# Patient Record
Sex: Female | Born: 2000 | Race: Black or African American | Hispanic: No | Marital: Single | Smoking: Current every day smoker
Health system: Southern US, Community
[De-identification: ages and names within clinical notes are randomized; demographics above are authoritative.]

## PROBLEM LIST (undated history)

## (undated) DIAGNOSIS — F32A Depression, unspecified: Secondary | ICD-10-CM

## (undated) DIAGNOSIS — F39 Unspecified mood [affective] disorder: Secondary | ICD-10-CM

## (undated) DIAGNOSIS — H539 Unspecified visual disturbance: Secondary | ICD-10-CM

## (undated) DIAGNOSIS — F329 Major depressive disorder, single episode, unspecified: Secondary | ICD-10-CM

## (undated) DIAGNOSIS — F909 Attention-deficit hyperactivity disorder, unspecified type: Secondary | ICD-10-CM

## (undated) DIAGNOSIS — F1910 Other psychoactive substance abuse, uncomplicated: Secondary | ICD-10-CM

## (undated) DIAGNOSIS — F1994 Other psychoactive substance use, unspecified with psychoactive substance-induced mood disorder: Secondary | ICD-10-CM

## (undated) DIAGNOSIS — E669 Obesity, unspecified: Secondary | ICD-10-CM

## (undated) DIAGNOSIS — J45909 Unspecified asthma, uncomplicated: Secondary | ICD-10-CM

## (undated) DIAGNOSIS — T7840XA Allergy, unspecified, initial encounter: Secondary | ICD-10-CM

## (undated) DIAGNOSIS — F913 Oppositional defiant disorder: Secondary | ICD-10-CM

## (undated) HISTORY — PX: FRACTURE SURGERY: SHX138

---
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
## 2019-05-24 ENCOUNTER — Ambulatory Visit (INDEPENDENT_AMBULATORY_CARE_PROVIDER_SITE_OTHER): Payer: Medicaid Other | Admitting: Pediatrics

## 2019-05-24 ENCOUNTER — Other Ambulatory Visit: Payer: Self-pay

## 2019-05-24 DIAGNOSIS — L739 Follicular disorder, unspecified: Secondary | ICD-10-CM

## 2019-05-24 MED ORDER — CEPHALEXIN 500 MG PO CAPS: 500.0000 mg | ORAL_CAPSULE | Freq: Three times a day (TID) | ORAL | 0 refills | Status: AC

## 2019-05-24 MED ORDER — IBUPROFEN 200 MG PO CAPS: 600.0000 mg | ORAL_CAPSULE | Freq: Four times a day (QID) | ORAL | 0 refills | Status: DC | PRN

## 2019-05-24 NOTE — Progress Notes (Signed)
Virtual Visit via Video Note  I connected with Lacey [name] 's patient  on 05/24/19 at  3:50 PM EDT by a video enabled telemedicine application and verified that I am speaking with the correct person using two identifiers.   Location of patient/parent: home [place]    I discussed the limitations of evaluation and management by telemedicine and the availability of in person appointments.  I discussed that the purpose of this telehealth visit is to provide medical care while limiting exposure to the novel coronavirus.  The mother expressed understanding and agreed to proceed.  Reason for visit: boil in groin area   History of Present Illness:  3 bumps in different areas along bikini line  Has been there for 3 days  Is painful and swollen  Is pus filled Has shaved that area approximately one month ago  Has happened in the past and required antibiotics No fevers or recent illness.   Observations/Objective:  Appears well in no acute distress.  Has 2 pustules with surrounding swelling in groin area and visible ingrown hairs.  No drainage noted  Tender to touch   Assessment and Plan:  18 yo F with folliculitis and associated skin soft tissue abscess developing.  Discussed warm compresses TID to aid in drainage and removal of ingrown hair if able.   Will begin with Keflex for strep and MSSA coverage and if not improved will ad MRSA coverage  Follow up precautions reviewed.   No orders of the defined types were placed in this encounter.  Meds ordered this encounter  Medications  . cephALEXin (KEFLEX) 500 MG capsule    Sig: Take 1 capsule (500 mg total) by mouth 3 (three) times daily for 7 days.    Dispense:  21 capsule    Refill:  0  . Ibuprofen 200 MG CAPS    Sig: Take 3 capsules (600 mg total) by mouth every 6 (six) hours as needed.    Dispense:  120 capsule    Refill:  0     Follow Up Instructions: PRN   I discussed the assessment and treatment plan with the patient and/or  parent/guardian. They were provided an opportunity to ask questions and all were answered. They agreed with the plan and demonstrated an understanding of the instructions.   They were advised to call back or seek an in-person evaluation in the emergency room if the symptoms worsen or if the condition fails to improve as anticipated.  I spent 15 minutes on this telehealth visit inclusive of face-to-face video and care coordination time I was located at [facility] during this encounter.  Georga Hacking, MD

## 2019-06-07 ENCOUNTER — Telehealth: Payer: Self-pay

## 2019-06-07 NOTE — Telephone Encounter (Signed)
LVM to prescreen LV

## 2019-06-08 ENCOUNTER — Ambulatory Visit (INDEPENDENT_AMBULATORY_CARE_PROVIDER_SITE_OTHER): Payer: Medicaid Other | Admitting: Pediatrics

## 2019-06-08 ENCOUNTER — Encounter: Payer: Self-pay | Admitting: Family

## 2019-06-08 ENCOUNTER — Other Ambulatory Visit: Payer: Self-pay

## 2019-06-08 VITALS — BP 131/92 | HR 82 | Ht 61.0 in | Wt 208.0 lb

## 2019-06-08 DIAGNOSIS — F1910 Other psychoactive substance abuse, uncomplicated: Secondary | ICD-10-CM | POA: Diagnosis not present

## 2019-06-08 DIAGNOSIS — Z113 Encounter for screening for infections with a predominantly sexual mode of transmission: Secondary | ICD-10-CM

## 2019-06-08 DIAGNOSIS — Z3046 Encounter for surveillance of implantable subdermal contraceptive: Secondary | ICD-10-CM | POA: Diagnosis not present

## 2019-06-08 DIAGNOSIS — N898 Other specified noninflammatory disorders of vagina: Secondary | ICD-10-CM

## 2019-06-08 NOTE — Progress Notes (Signed)

## 2019-06-08 NOTE — Patient Instructions (Signed)
   Your Nexplanon was removed today and is no longer preventing pregnancy.  If you have sex, remember to use condoms to prevent pregnancy and to prevent sexually transmitted infections.  Leave the outside bandage on for 24 hours.  Leave the smaller bandages on for 3-5 days or until they fall off on their own.  Keep the area clean and dry for 3-5 days.  There is usually bruising or swelling at and around the removal site for a few days to a week after the removal.  If you see redness or pus draining from the removal site, call us immediately.  We would like you to return to the clinic for a follow-up visit in 1 month.  You can call [facility] 24 hours a day with any questions or concerns.  There is always a nurse or doctor available to take your call.  Call 9-1-1 if you have a life-threatening emergency.  For anything else, please call us at [phone] before heading to the ER.

## 2019-06-08 NOTE — Progress Notes (Signed)
History was provided by the patient.  Lacey [name] is a 18 y.o. female who is here for nexplanon removal.  Ben-Davies, Nils Flack, MD   HPI:  Pt reports that she has had the nexplanon for almost 3 years and would like it removed today. She has had a lot of unpredictable bleeding that makes her not want another one at this time. She has considered depo which she has used in the past and is open to the idea of an IUD as she has some friends who have had good success with them.   She has no desire to be pregnant at this time, but does not wish to have a hormonal method of contraception today. She does intend to use condoms and took some from our supply.   Of note, she has been free from substance use for the last 4 months. I congratulated her on this big accomplishment.   She is living at home with her mother and father and describes her home situation as good.   She would like STI testing today. Has some vaginal discharge. No pain with sex.   No LMP recorded. Patient has had an implant.  Review of Systems  Constitutional: Negative for malaise/fatigue and weight loss.  Eyes: Negative for blurred vision.  Respiratory: Negative for shortness of breath.   Cardiovascular: Negative for chest pain and palpitations.  Gastrointestinal: Negative for abdominal pain, constipation, nausea and vomiting.  Genitourinary: Negative for dysuria.  Musculoskeletal: Negative for myalgias.  Neurological: Negative for dizziness and headaches.  Psychiatric/Behavioral: Negative for depression.    Patient Active Problem List   Diagnosis Date Noted  . Polysubstance dependence ([place])   . Substance induced mood disorder ([place])   . MDD (major depressive disorder), recurrent severe, without psychosis ([place]) 11/09/2018  . Polysubstance abuse ([place])   . Altered mental status   . Acute encephalopathy 11/09/2017  . Mild renal insufficiency 11/09/2017  . Anemia 11/09/2017  . Thrombocytosis ([place]) 11/09/2017  .  Nexplanon in place 09/14/2016  . Herpes 08/13/2016  . Other constipation 08/13/2016  . Bipolar 1 disorder, mixed, moderate ([place]) 05/15/2014  . MDD (major depressive disorder), recurrent episode, severe ([place]) 11/21/2013  . ADHD (attention deficit hyperactivity disorder), combined type 11/21/2013    Current Outpatient Medications on File Prior to Visit  Medication Sig Dispense Refill  . acetaminophen (TYLENOL) 325 MG tablet Take 2 tablets (650 mg total) by mouth every 6 (six) hours as needed for mild pain. (Patient not taking: Reported on 05/24/2019)    . bacitracin ointment Apply 1 application topically 2 (two) times daily. (Patient not taking: Reported on 05/24/2019) 1 g 0  . etonogestrel (NEXPLANON) 68 MG IMPL implant 1 each by Subdermal route once. Implanted approx 2016    . Ibuprofen 200 MG CAPS Take 3 capsules (600 mg total) by mouth every 6 (six) hours as needed. (Patient not taking: Reported on 06/08/2019) 120 capsule 0  . mirtazapine (REMERON) 30 MG tablet Take 1 tablet (30 mg total) by mouth at bedtime. For mood (Patient not taking: Reported on 05/24/2019) 30 tablet 0  . nicotine (NICODERM CQ - DOSED IN MG/24 HOURS) 21 mg/24hr patch Place 1 patch (21 mg total) onto the skin daily. For smoking cessation (Patient not taking: Reported on 05/24/2019) 28 patch 0  . valACYclovir (VALTREX) 500 MG tablet Take 2 tablets (1,000 mg total) by mouth See admin instructions. At onset of symptoms take 2 tablets (1000 mg) by mouth twice daily for 10 days (Patient not  taking: Reported on 05/24/2019) 30 tablet 0   No current facility-administered medications on file prior to visit.     Allergies  Allergen Reactions  . Fish Allergy Itching    Throat itching  . Shellfish Allergy Itching    Throat itching     Physical Exam:    Vitals:   06/08/19 0931  BP: (!) 131/92  Pulse: 82  Weight: 208 lb (94.3 kg)  Height: 5\' 1"  (1.549 m)    Blood pressure percentiles are not available for patients who are  18 years or older.  Physical Exam Constitutional:      Appearance: She is well-developed.  HENT:     Head: Normocephalic.  Neck:     Thyroid: No thyromegaly.  Cardiovascular:     Rate and Rhythm: Normal rate and regular rhythm.     Heart sounds: Normal heart sounds.  Pulmonary:     Effort: Pulmonary effort is normal.     Breath sounds: Normal breath sounds.  Abdominal:     General: Bowel sounds are normal.     Palpations: Abdomen is soft.     Tenderness: There is no abdominal tenderness.  Musculoskeletal: Normal range of motion.  Skin:    General: Skin is warm and dry.  Neurological:     Mental Status: She is alert and oriented to person, place, and time.     Assessment/Plan: 1. Encounter for Nexplanon removal See procedure note. I encouraged her to think strongly about another method of hormonal contraception for the future. We discussed condom use and pregnancy risk immediately after removal.   2. Polysubstance abuse ([place]) Currently stable and substance free. She notes it has been difficult, but agrees she is proud of herself.   3. Vaginal discharge Screen for STI today.  - WET PREP BY MOLECULAR PROBE  4. Routine screening for STI (sexually transmitted infection) Per pt request.  - C. trachomatis/N. gonorrhoeae RNA - HIV antibody (with reflex) - RPR

## 2019-06-09 LAB — WET PREP BY MOLECULAR PROBE
Candida species: NOT DETECTED
MICRO NUMBER:: 969754
SPECIMEN QUALITY:: ADEQUATE
Trichomonas vaginosis: NOT DETECTED

## 2019-06-09 LAB — HIV ANTIBODY (ROUTINE TESTING W REFLEX): HIV 1&2 Ab, 4th Generation: NONREACTIVE

## 2019-06-09 LAB — C. TRACHOMATIS/N. GONORRHOEAE RNA
C. trachomatis RNA, TMA: NOT DETECTED
N. gonorrhoeae RNA, TMA: NOT DETECTED

## 2019-06-09 LAB — RPR: RPR Ser Ql: NONREACTIVE

## 2019-06-12 ENCOUNTER — Other Ambulatory Visit: Payer: Self-pay | Admitting: Pediatrics

## 2019-06-12 MED ORDER — METRONIDAZOLE 500 MG PO TABS: 500.0000 mg | ORAL_TABLET | Freq: Two times a day (BID) | ORAL | 0 refills | Status: AC

## 2019-07-19 ENCOUNTER — Other Ambulatory Visit: Payer: Self-pay | Admitting: Pediatrics

## 2019-08-01 DIAGNOSIS — H5213 Myopia, bilateral: Secondary | ICD-10-CM | POA: Diagnosis not present

## 2019-08-14 ENCOUNTER — Other Ambulatory Visit: Payer: Self-pay

## 2019-08-14 ENCOUNTER — Ambulatory Visit (INDEPENDENT_AMBULATORY_CARE_PROVIDER_SITE_OTHER): Payer: Medicaid Other | Admitting: Pediatrics

## 2019-08-14 DIAGNOSIS — Z20828 Contact with and (suspected) exposure to other viral communicable diseases: Secondary | ICD-10-CM

## 2019-08-14 DIAGNOSIS — Z20822 Contact with and (suspected) exposure to covid-19: Secondary | ICD-10-CM

## 2019-08-14 NOTE — Progress Notes (Signed)
Virtual Visit via Video Note  I connected with Lacey [name] 's patient  on 08/14/19 at  3:30 PM EST by a video enabled telemedicine application and verified that I am speaking with the correct person using two identifiers.   Location of patient/parent: [place], [place]   I discussed the limitations of evaluation and management by telemedicine and the availability of in person appointments.  I discussed that the purpose of this telehealth visit is to provide medical care while limiting exposure to the novel coronavirus.  The patient expressed understanding and agreed to proceed.  Reason for visit: concerns for COVID  History of Present Illness:   Came in contact with someone with COVID eposure (boyfriend with COVID +).  coughig more than usual.  However the cough just started this mornng.  No fever. The contact was 1-2 days ago.  She was handing off a purse to the friend who had a boyfriend who has [place].   In addition, yesterday she was around her one hour.  And today for one hour she was around her thirty minutes and they rode in the care.    She went to an urgent care to get a test but she did not have a medicaid card.  She has had no loss of taste or smell.  Observations/Objective:   Well appearing adolescent.   Coughing throughout exam.   Assessment and Plan:   Given prolonged close exposure with close contact having close COVID exposure, likely that patient has indeed been exposed to [place] and is at risk.    1. Recommend testing at outpatient testing facility.  Orders entered and patient asked to text as appropriate for arranging appointment.   2. If there is severe shortness of breath, patient needs to seek medical attention at emergency room.    Follow Up Instructions:  Patient instructed to present for emergent care if there is dyspnea or severe cough.     I discussed the assessment and treatment plan with the patient and/or parent/guardian. They were provided an opportunity to  ask questions and all were answered. They agreed with the plan and demonstrated an understanding of the instructions.   They were advised to call back or seek an in-person evaluation in the emergency room if the symptoms worsen or if the condition fails to improve as anticipated.  I spent 15 minutes on this telehealth visit inclusive of face-to-face video and care coordination time I was located at [employer] and [facility]  during this encounter.  Theodis Sato, MD

## 2019-08-16 ENCOUNTER — Other Ambulatory Visit: Payer: Medicaid Other

## 2019-08-17 ENCOUNTER — Other Ambulatory Visit: Payer: Medicaid Other

## 2019-08-17 DIAGNOSIS — H5213 Myopia, bilateral: Secondary | ICD-10-CM | POA: Diagnosis not present

## 2019-11-03 ENCOUNTER — Telehealth: Payer: Medicaid Other

## 2019-11-07 ENCOUNTER — Other Ambulatory Visit: Payer: Self-pay

## 2019-11-07 ENCOUNTER — Other Ambulatory Visit: Payer: Self-pay | Admitting: Pediatrics

## 2019-11-07 NOTE — Telephone Encounter (Signed)
Please schedule patient for visit so we can discuss HSV and if she needs daily suppressive therapy.

## 2019-11-07 NOTE — Telephone Encounter (Signed)
Per Epic, appointment scheduled 11/13/19.

## 2019-11-07 NOTE — Telephone Encounter (Signed)
Lacey [name] requests new RX for valcyclovir be sent to [facility] on [address]; also asks that refills be available since her condition is ongoing.

## 2019-11-13 ENCOUNTER — Other Ambulatory Visit: Payer: Self-pay

## 2019-11-13 ENCOUNTER — Telehealth (INDEPENDENT_AMBULATORY_CARE_PROVIDER_SITE_OTHER): Payer: Medicaid Other | Admitting: Family

## 2019-11-13 ENCOUNTER — Encounter: Payer: Self-pay | Admitting: Family

## 2019-11-13 ENCOUNTER — Telehealth: Payer: Medicaid Other

## 2019-11-13 DIAGNOSIS — Z3009 Encounter for other general counseling and advice on contraception: Secondary | ICD-10-CM | POA: Diagnosis not present

## 2019-11-13 DIAGNOSIS — Z8659 Personal history of other mental and behavioral disorders: Secondary | ICD-10-CM | POA: Diagnosis not present

## 2019-11-13 DIAGNOSIS — B009 Herpesviral infection, unspecified: Secondary | ICD-10-CM | POA: Diagnosis not present

## 2019-11-13 DIAGNOSIS — F3162 Bipolar disorder, current episode mixed, moderate: Secondary | ICD-10-CM

## 2019-11-13 MED ORDER — VALACYCLOVIR HCL 1 G PO TABS: 1000.0000 mg | ORAL_TABLET | Freq: Every day | ORAL | 11 refills | Status: DC

## 2019-11-13 NOTE — Progress Notes (Signed)
This note is not being shared with the patient for the following reason: To respect privacy (The patient or proxy has requested that the information not be shared).  THIS RECORD MAY CONTAIN CONFIDENTIAL INFORMATION THAT SHOULD NOT BE RELEASED WITHOUT REVIEW OF THE SERVICE PROVIDER.  Virtual Follow-Up Visit via Video Note  I connected with Lacey [name] 's patient  on 11/13/19 at  9:30 AM EDT by an audio enabled telemedicine application and verified that I am speaking with the correct person using two identifiers.   Patient/parent location: home in [place]   I discussed the limitations of evaluation and management by telemedicine and the availability of in person appointments.  I discussed that the purpose of this telehealth visit is to provide medical care while limiting exposure to the novel coronavirus.  The patient expressed understanding and agreed to proceed.   Lacey [name] is a 19 y.o. female referred by Lacey [name], * here today for follow-up of bipolar disorder type 1, history of polysubstance abuse, history of STIs.  Previsit planning completed:  yes   History was provided by the patient.  Plan from Last Visit:   -nexplanon removed 06/07/20  Chief Complaint: HSV2  History of Present Illness:  Lacey [name] reports that goal of the visit today is to refill valtrex. Has been trying for the past 2 months to get refills, but has been unable to get them.   She first noted increased HSV 2 outbreaks in January 2021. Thought it was razor burn, but then saw lesions peeling. Will have an individual lesion that lasts about 1wk before resolving, but pain is persistent. Most recent outbreak was after LMP, last week- still some visible hyperpigmented spots. Stopped shaving ~51yr ago because she thought these spots were ingrown hairs. Reports that outbreaks are sometimes lasting up to 2wks and cause pain with ambulation. No oral outbreaks. She is not able to quantify # of outbreaks this year,  but reports that it is many and worsening severity. She thinks that triggers are increased stress and sexual activity.  Reports that she still has some leftover valtrex, but the bottle states discard after 2021, so she stopped taking it. She last took a dose in January or February. Last Rx was sent several years ago.  She is sexually active currently, both oral and vaginal sex. Uses condoms, reports that she does have 1 partner with whom she has unprotected sex. He is aware of her HSV status and is not a new partner. She has had 1 new female partner since last visit in October and they did use a condom. She had her nexplanon removed in 06/2019 and did not have another replaced. Has been thinking about OCPs, but is worried about weight gain. Does not want depo for that reason.  No other rashes. No fevers. No changes in discharge. No dyspareunia. Occassional morning abdominal pain- thought to be positional after sleeping on couch. No n/v/d. No cough, respiratory symptom.   Still using marijuana. No other drug use.   Regarding stress, she reports that she has been under more stress since the start of 2021. Current stressors include trying to go back to school, and history of mental health issues that she is no longer on medication for. Reports that she has been more aggravated, irritable. Has been off of medications for depression for 3-4 years. No current SI/HI.   Current meds:  -none  Has thought about restarting remeron and concerta. Reports that "concerta was the best thing that  happened to her" but that when she was younger she did not see it that way and did not want to take it. She now realizes that she could accomplish more with improved focus and concentration. She is interested in resuming these meds.   Starting [facility] program March 22nd.   Currently lives with mother, father, and 2 younger sisters.   ROS:  Per HPI  Allergies  Allergen Reactions  . Fish Allergy Itching    Throat itching   . Shellfish Allergy Itching    Throat itching   No outpatient medications prior to visit.   No facility-administered medications prior to visit.     Patient Active Problem List   Diagnosis Date Noted  . Polysubstance dependence ([place])   . Substance induced mood disorder ([place])   . MDD (major depressive disorder), recurrent severe, without psychosis ([place]) 11/09/2018  . Polysubstance abuse ([place])   . Altered mental status   . Acute encephalopathy 11/09/2017  . Mild renal insufficiency 11/09/2017  . Anemia 11/09/2017  . Thrombocytosis ([place]) 11/09/2017  . Nexplanon in place 09/14/2016  . Herpes 08/13/2016  . Other constipation 08/13/2016  . Bipolar 1 disorder, mixed, moderate ([place]) 05/15/2014  . MDD (major depressive disorder), recurrent episode, severe ([place]) 11/21/2013  . ADHD (attention deficit hyperactivity disorder), combined type 11/21/2013    Social History: Lives with:  mother, father and 2 sisters and describes home situation as fine School: dropped out in 9th grade; is resuming GED program March 2021  Confidentiality was discussed with the patient and if applicable, with caregiver as well.  Patient's personal or confidential phone number: [phone] Enter confidential phone number in Family Comments section of [employer] Tobacco?  no Drugs/ETOH?  Current marijuana use, prior cocaine and polysubstance use- has been sober x73yr and reports that she never wants to do that again Partner preference?  female Sexually Active?  yes  Pregnancy Prevention:  condoms, reviewed condoms & plan B Trauma currently or in the pastt?  yes Suicidal or Self-Harm thoughts?   yes, in the past, none current  The following portions of the patient's history were reviewed and updated as appropriate: allergies, current medications, past family history, past medical history, past social history, past surgical history and problem list.  Visual Observations/Objective:   Unable to complete physical  exam given telephone encounter   PHQ-SADS Last 3 Score only 11/13/2019  PHQ-15 Score 13  Total GAD-7 Score 16  Score 21  PHQ-9: 23  Assessment/Plan: Lacey [name] is a 19yo AFAB-IAF with history of polysubstance abuse (sober x50yr, not in treatment program), bipolar disorder type 1, ADHD, and HSV2 who presents to clinic for increasing frequency of HSV2 outbreaks. For the past 3 months, she has had increasing outbreaks, which she attributes to stress and sexual activity. Given the frequency of outbreaks, she would now benefit from suppressive therapy with valtrex 1g daily, which was prescribed today. She had recent STI testing (HIV, RPR, GC/Chlamydia) in 06/2019 which was negative. Though she has not had many new partners and does not have any symptoms concerning for new STI or PID, she would benefit from repeat testing and pregnancy test given history of recent unprotected sex not on birth control. Discussed need for birth control today, and she expressed interest in starting something for contraception as she knows that she does not want children at this time. However, she is very concerned about weight gain and thus would like to discuss options that are less likely to cause significant weight gain.  Will plan to bring her back for in person visit to discuss options further and initiate birth control while obtaining labs.   Regarding her mood and psychiatric history, I am concerned that she is feeling more depressed in the past 3 months. PHQ-SADS score was quite elevated with PHQ-9 score of 23. Fortunately, she does not have any current SI/HI. Discussed that she would benefit from establishing care with a therapist and potentially resuming some of her medications. She is in agreement with this plan and would like to meet with behavioral health when in clinic and discuss restarting pysch medications after her in person visit.   Lastly, she is curious about restarting concerta for ADHD. Given history of  ADHD and current desire to resume GED program, this seems reasonable, however most recent clinic BP is elevated and would want to be cautious about stimulant use with history of bipolar disorder not currently on treatment, thus will wait to discuss this further until she is seen in person for exam/vitals and other mental health issues are addressed.   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  Dispense: 30 tablet; Refill: 11 -discussed plan to start suppressive therapy with patient, who expressed understanding -will reevaluate need for suppressive therapy in 1 year -obtain CMP when next in clinic to assess renal and hepatic function  2. Bipolar 1 disorder, mixed, moderate ([facility]) -PHQ-SADS score concerning for current depressive episode -plan to meet with behavioral health at next visit -advised that patient seek out therapist on [email or web address] -consider resuming prior psych medications (has previously been prescribed Geodon, lamictal, wellbutrin, remeron, trazodone; most recently March 2020 received Geodon 10mg  injection and was discharged with 30mg  remeron nightly)   3. History of ADHD -previously on Concerta 54mg  daily -consider restarting at lower dose once on treatment for bipolar disorder and BP checked  4. Encounter for counseling regarding contraception -patient currently using condoms intermittently  -needs UPT at next visit -interested in OCPs, although she may benefit from LARC such as IUD or nexplanon -desires contraception with minimal risk of weight gain  I discussed the assessment and treatment plan with the patient and/or parent/guardian.  They were provided an opportunity to ask questions and all were answered.  They agreed with the plan and demonstrated an understanding of the instructions. They were advised to call back or seek an in-person evaluation in the emergency room if the  symptoms worsen or if the condition fails to improve as anticipated.   Follow-up:   1 week  Medical decision-making:   I spent 33 minutes on this telehealth visit inclusive of face-to-face video and care coordination time I was located off site during this encounter.   Toney Rakes, MD    CC: Parthenia Ames, NP, Lacey [name], *  Supervising Provider Co-Signature  I reviewed with the resident the medical history and the resident's findings.  I discussed with the resident the patient's diagnosis and concur with the treatment plan as documented in the resident's note.  Parthenia Ames, NP

## 2019-11-14 NOTE — Patient Instructions (Signed)
It was a pleasure to talk with you today! We have sent a prescription for 1 year of suppressive therapy with Valtrex. You will take this daily.   We will schedule you for an in person visit as soon as possible to get an exam, check your blood pressure, get labs, and discuss birth control and anxiety medications further.

## 2019-11-27 ENCOUNTER — Ambulatory Visit (INDEPENDENT_AMBULATORY_CARE_PROVIDER_SITE_OTHER): Payer: Medicaid Other | Admitting: Pediatrics

## 2019-11-27 ENCOUNTER — Other Ambulatory Visit: Payer: Self-pay

## 2019-11-27 ENCOUNTER — Encounter: Payer: Self-pay | Admitting: Pediatrics

## 2019-11-27 ENCOUNTER — Other Ambulatory Visit: Payer: Self-pay | Admitting: Emergency Medicine

## 2019-11-27 ENCOUNTER — Other Ambulatory Visit (HOSPITAL_COMMUNITY)
Admission: RE | Admit: 2019-11-27 | Discharge: 2019-11-27 | Disposition: A | Discharge status: Home / Self Care | Payer: Medicaid Other | Source: Ambulatory Visit | Attending: Pediatrics | Admitting: Pediatrics

## 2019-11-27 VITALS — BP 119/71 | HR 89 | Ht 61.42 in | Wt 222.2 lb

## 2019-11-27 DIAGNOSIS — E6609 Other obesity due to excess calories: Secondary | ICD-10-CM | POA: Diagnosis not present

## 2019-11-27 DIAGNOSIS — O99210 Obesity complicating pregnancy, unspecified trimester: Secondary | ICD-10-CM | POA: Insufficient documentation

## 2019-11-27 DIAGNOSIS — Z113 Encounter for screening for infections with a predominantly sexual mode of transmission: Secondary | ICD-10-CM

## 2019-11-27 DIAGNOSIS — F902 Attention-deficit hyperactivity disorder, combined type: Secondary | ICD-10-CM | POA: Diagnosis not present

## 2019-11-27 DIAGNOSIS — Z7251 High risk heterosexual behavior: Secondary | ICD-10-CM

## 2019-11-27 DIAGNOSIS — N921 Excessive and frequent menstruation with irregular cycle: Secondary | ICD-10-CM | POA: Insufficient documentation

## 2019-11-27 DIAGNOSIS — F3162 Bipolar disorder, current episode mixed, moderate: Secondary | ICD-10-CM

## 2019-11-27 DIAGNOSIS — Z3202 Encounter for pregnancy test, result negative: Secondary | ICD-10-CM | POA: Diagnosis not present

## 2019-11-27 DIAGNOSIS — E66812 Obesity, class 2: Secondary | ICD-10-CM

## 2019-11-27 DIAGNOSIS — N920 Excessive and frequent menstruation with regular cycle: Secondary | ICD-10-CM | POA: Diagnosis not present

## 2019-11-27 LAB — POCT URINE PREGNANCY: Preg Test, Ur: NEGATIVE

## 2019-11-27 MED ORDER — NORETHINDRONE ACET-ETHINYL EST 1.5-30 MG-MCG PO TABS: 1.0000 | ORAL_TABLET | Freq: Every day | ORAL | 11 refills | Status: DC

## 2019-11-27 NOTE — Patient Instructions (Signed)
Oral Contraception Use Oral contraceptive pills (OCPs) are medicines that you take to prevent pregnancy. OCPs work by:  Preventing the ovaries from releasing eggs.  Thickening mucus in the lower part of the uterus (cervix), which prevents sperm from entering the uterus.  Thinning the lining of the uterus (endometrium), which prevents a fertilized egg from attaching to the endometrium. OCPs are highly effective when taken exactly as prescribed. However, OCPs do not prevent sexually transmitted infections (STIs). Safe sex practices, such as using condoms while on an OCP, can help prevent STIs. Before taking OCPs, you may have a physical exam, blood test, and Pap test. A Pap test involves taking a sample of cells from your cervix to check for cancer. Discuss with your health care provider the possible side effects of the OCP you may be prescribed. When you start an OCP, be aware that it can take 2-3 months for your body to adjust to changes in hormone levels. How to take oral contraceptive pills Follow instructions from your health care provider about how to start taking your first cycle of OCPs. Your health care provider may recommend that you:  Start the pill on day 1 of your menstrual period. If you start at this time, you will not need any backup form of birth control (contraception), such as condoms.  Start the pill on the first Sunday after your menstrual period or on the day you get your prescription. In these cases, you will need to use backup contraception for the first week.  Start the pill at any time of your cycle. ? If you take the pill within 5 days of the start of your period, you will not need a backup form of contraception. ? If you start at any other time of your menstrual cycle, you will need to use another form of contraception for 7 days. If your OCP is the type called a minipill, it will protect you from pregnancy after taking it for 2 days (48 hours), and you can stop using  backup contraception after that time. After you have started taking OCPs:  If you forget to take 1 pill, take it as soon as you remember. Take the next pill at the regular time.  If you miss 2 or more pills, call your health care provider. Different pills have different instructions for missed doses. Use backup birth control until your next menstrual period starts.  If you use a 28-day pack that contains inactive pills and you miss 1 of the last 7 pills (pills with no hormones), throw away the rest of the non-hormone pills and start a new pill pack. No matter which day you start the OCP, you will always start a new pack on that same day of the week. Have an extra pack of OCPs and a backup contraceptive method available in case you miss some pills or lose your OCP pack. Follow these instructions at home:  Do not use any products that contain nicotine or tobacco, such as cigarettes and e-cigarettes. If you need help quitting, ask your health care provider.  Always use a condom to protect against STIs. OCPs do not protect against STIs.  Use a calendar to mark the days of your menstrual period.  Read the information and directions that came with your OCP. Talk to your health care provider if you have questions. Contact a health care provider if:  You develop nausea and vomiting.  You have abnormal vaginal discharge or bleeding.  You develop a rash.    You miss your menstrual period. Depending on the type of OCP you are taking, this may be a sign of pregnancy. Ask your health care provider for more information.  You are losing your hair.  You need treatment for mood swings or depression.  You get dizzy when taking the OCP.  You develop acne after taking the OCP.  You become pregnant or think you may be pregnant.  You have diarrhea, constipation, and abdominal pain or cramps.  You miss 2 or more pills. Get help right away if:  You develop chest pain.  You develop shortness of  breath.  You have an uncontrolled or severe headache.  You develop numbness or slurred speech.  You develop visual or speech problems.  You develop pain, redness, and swelling in your legs.  You develop weakness or numbness in your arms or legs. Summary  Oral contraceptive pills (OCPs) are medicines that you take to prevent pregnancy.  OCPs do not prevent sexually transmitted infections (STIs). Always use a condom to protect against STIs.  When you start an OCP, be aware that it can take 2-3 months for your body to adjust to changes in hormone levels.  Read all the information and directions that come with your OCP. This information is not intended to replace advice given to you by your health care provider. Make sure you discuss any questions you have with your health care provider. Document Revised: 12/09/2018 Document Reviewed: 09/28/2016 Elsevier Patient Education  2020 [employer]  

## 2019-11-27 NOTE — Progress Notes (Signed)
THIS RECORD MAY CONTAIN CONFIDENTIAL INFORMATION THAT SHOULD NOT BE RELEASED WITHOUT REVIEW OF THE SERVICE PROVIDER.  Adolescent Medicine Consultation Follow-Up Visit Lacey [name]  is a 19 y.o. female referred by Parthenia Ames, NP here today for follow-up regarding HSV lesions, mental health, and BC options.     Plan at last adolescent specialty clinic visit included  - starting on daily Valtrex for HSV suppression. Needs labs today. - RTC for STD testing and to discuss [facility] options. - Concern for worsening depression in setting of Bipolar 1 -- recommended getting plugged into therapy. Also concerned about Hx of ADHD.  Pertinent Labs? No Growth Chart Viewed? yes   History was provided by the patient.  Interpreter? no  Chief complaint: Attention issues  HPI:    My Chart Activated?   no  Patient's personal or confidential phone number: updated in chart  Mental health concerns: Patient still reports attention issues, especially now that she is taking classes. Also reports that she has had more issues with her mood, particularly depression, recently, though attention is more of her concern. She denies SI/HI. She has not seen a psychiatrist before, though has had therapy with Sharlyne Pacas in [place] before, but hasn't gone in a while.   HSV: Patient started valtrex as prescribed and lesions resolved within 2 weeks. Now without any lesions or pain. No vaginal discharge or bleeding. Still occasionally with unprotected sex.  Abdominal pain: Has had lower and R sided abdominal pain in the mornings only for the past couple of weeks. Initially thought it was related to HSV, though now not sure as the pain has persisted beyond the lesions improving. The pain is stabbing in quality. She thinks it may be related to sleeping on the couch. No vomiting, diarrhea, or constipation. No pain with sex. Pain is reproducible every morning and is otherwise not paroxysmal. She notes that she has gained  a lot of weight recently due to "eating a lot". She does endorse frequent MJ use.   BC: Patient is sexually active with female partners and occasionally has unprotected sex. Wants to talk about BC. Has had nexplanon in the past but had breakthrough bleeding that she did not like. Had depo before that and didn't like it because she felt it caused weight gain. Has thought of IUD, though her mother strongly recommended against it as "it will fall out". After much discussion, she expressed preference for pills. No personal history of migraines, though dad has them. No personal history of clots; only one in family who had one was sister, though she had cancer (passed away from heart failure as a complication of chemotherapy).   Menstrual history: LMP 3 weeks ago. Periods come monthly, last 7 days, bleeds through tampons with pad every hour or so the first couple of days. No bad cramping.   Review of Systems negative except where noted above   Allergies  Allergen Reactions  . Fish Allergy Itching    Throat itching  . Shellfish Allergy Itching    Throat itching   Current Outpatient Medications on File Prior to Visit  Medication Sig Dispense Refill  . valACYclovir (VALTREX) 1000 MG tablet Take 1 tablet (1,000 mg total) by mouth daily. 30 tablet 11   No current facility-administered medications on file prior to visit.    Patient Active Problem List   Diagnosis Date Noted  . Polysubstance dependence ([place])   . Substance induced mood disorder ([place])   . MDD (major depressive disorder), recurrent  severe, without psychosis ([place]) 11/09/2018  . Polysubstance abuse ([place])   . Altered mental status   . Acute encephalopathy 11/09/2017  . Mild renal insufficiency 11/09/2017  . Anemia 11/09/2017  . Thrombocytosis ([place]) 11/09/2017  . Nexplanon in place 09/14/2016  . Herpes 08/13/2016  . Other constipation 08/13/2016  . Bipolar 1 disorder, mixed, moderate ([place]) 05/15/2014  . MDD (major depressive  disorder), recurrent episode, severe ([place]) 11/21/2013  . ADHD (attention deficit hyperactivity disorder), combined type 11/21/2013    Confidentiality was discussed with the patient and if applicable, with caregiver as well.  Changes at home or school since last visit:  no  Drugs/ETOH?  yes, MJ Partner preference?  female  Sexually Active?  yes; sometimes uses condoms  Suicidal or homicidal thoughts?   no Self injurious behaviors?  no   The following portions of the patient's history were reviewed and updated as appropriate: allergies, current medications, past family history, past medical history, past social history, past surgical history and problem list.  Physical Exam:  Vitals:   11/27/19 0907  BP: 119/71  Pulse: 89  Weight: 222 lb 3.2 oz (100.8 kg)  Height: 5' 1.42" (1.56 m)   BP 119/71   Pulse 89   Ht 5' 1.42" (1.56 m)   Wt 222 lb 3.2 oz (100.8 kg)   BMI 41.42 kg/m  Body mass index: body mass index is 41.42 kg/m. Blood pressure percentiles are not available for patients who are 18 years or older.   Physical Exam Vitals and nursing note reviewed.  Constitutional:      General: She is not in acute distress.    Appearance: She is well-developed. She is obese.  HENT:     Head: Normocephalic and atraumatic.     Right Ear: External ear normal.     Left Ear: External ear normal.  Eyes:     General:        Right eye: No discharge.        Left eye: No discharge.     Conjunctiva/sclera: Conjunctivae normal.     Pupils: Pupils are equal, round, and reactive to light.  Cardiovascular:     Rate and Rhythm: Normal rate and regular rhythm.     Heart sounds: Normal heart sounds. No murmur.  Pulmonary:     Effort: Pulmonary effort is normal.     Breath sounds: Normal breath sounds. No wheezing or rales.  Abdominal:     General: Bowel sounds are normal. There is no distension.     Palpations: Abdomen is soft. There is no mass.     Tenderness: There is no abdominal  tenderness.     Comments: With tenderness to palpation of the suprapubic region and RLQ that extends just superiorly to the iliac crest and continues to the mid axillary lines. No rebound tenderness.   Genitourinary:    Comments: External exam only. With healing HSV lesions without active discharge on the anterior groin, sparing mons. No bleeding or discharge noted at the introitus.  Musculoskeletal:        General: No deformity. Normal range of motion.     Cervical back: Normal range of motion and neck supple.  Lymphadenopathy:     Cervical: No cervical adenopathy.  Skin:    General: Skin is warm and dry.     Capillary Refill: Capillary refill takes less than 2 seconds.     Coloration: Skin is not pale.     Findings: No rash.  Neurological:  Mental Status: She is alert and oriented to person, place, and time.     Deep Tendon Reflexes: Reflexes are normal and symmetric.  Psychiatric:        Behavior: Behavior normal.     Assessment/Plan:  1. Menorrhagia with regular cycle 2. History of unprotected sex 3. Pregnancy examination or test, negative result - Discussed all BC options, including effectiveness, risks, and benefits of each today. Patient expressed interest in OCPs today. Will start on Junel.  - history of clotting in sister, but this was likely related to her cancer. Regardless, counseled patient on increased risk of clots. She expressed understanding.  - Hopefully this will help with her menorrhagia - discussed importance of condoms in preventing STIs and discussed EC options.   - CBC with Differential/Platelet - Thyroid Panel With TSH - Ferritin - Norethindrone Acetate-Ethinyl Estradiol (JUNEL 1.5/30) 1.5-30 MG-MCG tablet; Take 1 tablet by mouth daily.  Dispense: 1 Package; Refill: 11   4. Routine screening for STI (sexually transmitted infection) - Has history of HSV2 - Hx of unprotected sex, will perform screening.   - POCT urine pregnancy - Urine cytology  ancillary only - HIV Antibody (routine testing w rflx) - RPR - C. trachomatis/N. gonorrhoeae RNA  5. Bipolar 1 disorder, mixed, moderate (HCC) 6. ADHD (attention deficit hyperactivity disorder), combined type - given complicated psychiatric history and multiple meds in the past, would benefit from the care of a dedicated psychiatrist (see prior notes for full med list).  - Will refer to psychiatry and therapy today - no SI/HI  - Ambulatory referral to Psychiatry - Ambulatory referral to [facility]  7. Class 2 obesity due to excess calories in adult, unspecified BMI, unspecified whether serious comorbidity present - patient with significant weight gain recently. - unsure of who her PCP is and when she last got labs. - will get obesity labs today. Fortunately with good BP. Healthy lifestyles counseling re: diet and activity were not in the scope of today's talks.   - Hemoglobin A1c - Comprehensive metabolic panel - Lipid panel - VITAMIN D 25 Hydroxy (Vit-D Deficiency, Fractures) - CBC with Differential/Platelet - Thyroid Panel With TSH   BH screenings: none today  Follow-up:  Return for F/u [facility] and HSV with [facility] on 4/28 at 2:30 virtual.   Medical decision-making:  >30 minutes spent face to face with patient with more than 50% of appointment spent discussing diagnosis, management, follow-up, and reviewing of prior records.  Renee Rival, MD

## 2019-11-28 LAB — RPR: RPR Ser Ql: NONREACTIVE

## 2019-11-28 LAB — COMPREHENSIVE METABOLIC PANEL
AG Ratio: 1.6 (calc) (ref 1.0–2.5)
ALT: 17 U/L (ref 5–32)
AST: 13 U/L (ref 12–32)
Albumin: 4.2 g/dL (ref 3.6–5.1)
Alkaline phosphatase (APISO): 81 U/L (ref 36–128)
BUN: 9 mg/dL (ref 7–20)
CO2: 25 mmol/L (ref 20–32)
Calcium: 9.5 mg/dL (ref 8.9–10.4)
Chloride: 107 mmol/L (ref 98–110)
Creat: 0.67 mg/dL (ref 0.50–1.00)
Globulin: 2.6 g/dL (calc) (ref 2.0–3.8)
Glucose, Bld: 80 mg/dL (ref 65–99)
Potassium: 4.9 mmol/L (ref 3.8–5.1)
Sodium: 139 mmol/L (ref 135–146)
Total Bilirubin: 0.2 mg/dL (ref 0.2–1.1)
Total Protein: 6.8 g/dL (ref 6.3–8.2)

## 2019-11-28 LAB — CBC WITH DIFFERENTIAL/PLATELET
Absolute Monocytes: 542 cells/uL (ref 200–950)
Basophils Absolute: 19 cells/uL (ref 0–200)
Basophils Relative: 0.3 %
Eosinophils Absolute: 410 cells/uL (ref 15–500)
Eosinophils Relative: 6.5 %
HCT: 37.7 % (ref 35.0–45.0)
Hemoglobin: 12.4 g/dL (ref 11.7–15.5)
Lymphs Abs: 2955 cells/uL (ref 850–3900)
MCH: 25.4 pg — ABNORMAL LOW (ref 27.0–33.0)
MCHC: 32.9 g/dL (ref 32.0–36.0)
MCV: 77.3 fL — ABNORMAL LOW (ref 80.0–100.0)
MPV: 10.7 fL (ref 7.5–12.5)
Monocytes Relative: 8.6 %
Neutro Abs: 2375 cells/uL (ref 1500–7800)
Neutrophils Relative %: 37.7 %
Platelets: 344 10*3/uL (ref 140–400)
RBC: 4.88 10*6/uL (ref 3.80–5.10)
RDW: 14.7 % (ref 11.0–15.0)
Total Lymphocyte: 46.9 %
WBC: 6.3 10*3/uL (ref 3.8–10.8)

## 2019-11-28 LAB — FERRITIN: Ferritin: 8 ng/mL — ABNORMAL LOW (ref 16–154)

## 2019-11-28 LAB — LIPID PANEL
Cholesterol: 146 mg/dL (ref <170)
HDL: 44 mg/dL — ABNORMAL LOW (ref >45)
LDL Cholesterol (Calc): 75 mg/dL (calc) (ref <110)
Non-HDL Cholesterol (Calc): 102 mg/dL (calc) (ref <120)
Total CHOL/HDL Ratio: 3.3 (calc) (ref <5.0)
Triglycerides: 174 mg/dL — ABNORMAL HIGH (ref <90)

## 2019-11-28 LAB — THYROID PANEL WITH TSH
Free Thyroxine Index: 2 (ref 1.4–3.8)
T3 Uptake: 28 % (ref 22–35)
T4, Total: 7 ug/dL (ref 5.3–11.7)
TSH: 0.73 mIU/L

## 2019-11-28 LAB — VITAMIN D 25 HYDROXY (VIT D DEFICIENCY, FRACTURES): Vit D, 25-Hydroxy: 8 ng/mL — ABNORMAL LOW (ref 30–100)

## 2019-11-28 LAB — HEMOGLOBIN A1C
Hgb A1c MFr Bld: 5.2 % of total Hgb (ref <5.7)
Mean Plasma Glucose: 103 (calc)
eAG (mmol/L): 5.7 (calc)

## 2019-11-28 LAB — HIV ANTIBODY (ROUTINE TESTING W REFLEX): HIV 1&2 Ab, 4th Generation: NONREACTIVE

## 2019-11-28 NOTE — Progress Notes (Signed)
I have reviewed the resident's note and plan of care and helped develop the plan as necessary. I served as Producer, television/film/video for physical exam.  Jonathon Resides, FNP

## 2019-11-30 ENCOUNTER — Other Ambulatory Visit: Payer: Self-pay | Admitting: Pediatrics

## 2019-11-30 LAB — URINE CYTOLOGY ANCILLARY ONLY
Bacterial Vaginitis-Urine: POSITIVE — AB
Bacterial Vaginitis-Urine: POSITIVE — AB
Bacterial Vaginitis-Urine: POSITIVE — AB
Candida Urine: NEGATIVE
Candida vaginitis: NEGATIVE
Chlamydia: NEGATIVE
Comment: NEGATIVE
Comment: NEGATIVE
Comment: NORMAL
Neisseria Gonorrhea: NEGATIVE
Trichomonas: NEGATIVE

## 2019-11-30 MED ORDER — METRONIDAZOLE 500 MG PO TABS: 500.0000 mg | ORAL_TABLET | Freq: Two times a day (BID) | ORAL | 0 refills | Status: AC

## 2019-11-30 MED ORDER — FERROUS FUMARATE 325 (106 FE) MG PO TABS: 1.0000 | ORAL_TABLET | Freq: Every day | ORAL | 3 refills | Status: DC

## 2019-11-30 MED ORDER — VITAMIN D (ERGOCALCIFEROL) 1.25 MG (50000 UNIT) PO CAPS: 50000.0000 [IU] | ORAL_CAPSULE | ORAL | 0 refills | Status: DC

## 2019-12-27 ENCOUNTER — Telehealth (INDEPENDENT_AMBULATORY_CARE_PROVIDER_SITE_OTHER): Payer: Medicaid Other | Admitting: Pediatrics

## 2019-12-27 DIAGNOSIS — Z3041 Encounter for surveillance of contraceptive pills: Secondary | ICD-10-CM

## 2019-12-27 DIAGNOSIS — R69 Illness, unspecified: Secondary | ICD-10-CM

## 2019-12-27 NOTE — Progress Notes (Signed)
This note is not being shared with the patient for the following reason: To prevent harm (release of this note would result in harm to the life or physical safety of the patient or another).  THIS RECORD MAY CONTAIN CONFIDENTIAL INFORMATION THAT SHOULD NOT BE RELEASED WITHOUT REVIEW OF THE SERVICE PROVIDER.  Virtual Follow-Up Visit via Video Note  I connected with Lacey [name], the patient  on 12/27/19 at  2:30 PM EDT by a video enabled telemedicine application and verified that I am speaking with the correct person using two identifiers.   Patient/parent location: At home in [place], [place]   I discussed the limitations of evaluation and management by telemedicine and the availability of in person appointments.  I discussed that the purpose of this telehealth visit is to provide medical care while limiting exposure to the novel coronavirus.  The patient expressed understanding but requested to reschedule this appt because she has not yet started the birth control prescribed at the last visit. Would like to try before having a visit.   - Discussed trying to follow up in 1-2 weeks before ending the phone call.   Magda Kiel, MD   CC: Parthenia Ames, NP, Parthenia Ames, NP

## 2020-01-10 ENCOUNTER — Telehealth: Payer: Medicaid Other | Admitting: Family

## 2020-01-23 ENCOUNTER — Ambulatory Visit: Payer: Medicaid Other | Admitting: Pediatrics

## 2020-01-24 ENCOUNTER — Encounter: Payer: Self-pay | Admitting: Family

## 2020-01-25 ENCOUNTER — Ambulatory Visit (INDEPENDENT_AMBULATORY_CARE_PROVIDER_SITE_OTHER): Payer: Medicaid Other | Admitting: Pediatrics

## 2020-01-25 ENCOUNTER — Other Ambulatory Visit: Payer: Self-pay | Admitting: Emergency Medicine

## 2020-01-25 ENCOUNTER — Other Ambulatory Visit: Payer: Self-pay

## 2020-01-25 ENCOUNTER — Other Ambulatory Visit (HOSPITAL_COMMUNITY)
Admission: RE | Admit: 2020-01-25 | Discharge: 2020-01-25 | Disposition: A | Discharge status: Home / Self Care | Payer: Medicaid Other | Source: Ambulatory Visit | Attending: Pediatrics | Admitting: Pediatrics

## 2020-01-25 ENCOUNTER — Encounter: Payer: Self-pay | Admitting: Pediatrics

## 2020-01-25 VITALS — BP 114/77 | HR 98 | Ht 61.52 in | Wt 223.6 lb

## 2020-01-25 DIAGNOSIS — Z7251 High risk heterosexual behavior: Secondary | ICD-10-CM | POA: Diagnosis not present

## 2020-01-25 DIAGNOSIS — B009 Herpesviral infection, unspecified: Secondary | ICD-10-CM | POA: Diagnosis not present

## 2020-01-25 DIAGNOSIS — Z3202 Encounter for pregnancy test, result negative: Secondary | ICD-10-CM | POA: Diagnosis not present

## 2020-01-25 DIAGNOSIS — Z113 Encounter for screening for infections with a predominantly sexual mode of transmission: Secondary | ICD-10-CM | POA: Insufficient documentation

## 2020-01-25 MED ORDER — VALACYCLOVIR HCL 1 G PO TABS: 1000.0000 mg | ORAL_TABLET | Freq: Every day | ORAL | 2 refills | Status: DC

## 2020-01-25 MED ORDER — ULIPRISTAL ACETATE 30 MG PO TABS: 1.0000 | ORAL_TABLET | Freq: Once | ORAL | 1 refills | Status: AC

## 2020-01-25 NOTE — Progress Notes (Signed)
History was provided by the patient.  Lacey [name] is a 19 y.o. female who is here for missed period.  No primary care provider on file.   HPI:  Pt reports that she is 25 days late for her period. She has had intermittent cramping but it never started.   Never started OCP. Not currently sexually active. Last time was last week. She did not use protection at that time. Says that she normally does use condoms. No itching, burning, pain.   Not currently taking any medications for her mood. No major highs/lows or SI.   No recent substance use. Last time was about a year.   Living with mom.  Patient's last menstrual period was 12/04/2019.  Review of Systems  Constitutional: Negative for malaise/fatigue.  Eyes: Negative for double vision.  Respiratory: Negative for shortness of breath.   Cardiovascular: Negative for chest pain and palpitations.  Gastrointestinal: Negative for abdominal pain, constipation, diarrhea, nausea and vomiting.  Genitourinary: Negative for dysuria.  Musculoskeletal: Negative for joint pain and myalgias.  Skin: Negative for rash.  Neurological: Negative for dizziness and headaches.  Endo/Heme/Allergies: Does not bruise/bleed easily.    Patient Active Problem List   Diagnosis Date Noted  . Class 2 obesity due to excess calories in adult 11/27/2019  . Menorrhagia with regular cycle 11/27/2019  . Polysubstance dependence ([place])   . Substance induced mood disorder ([place])   . MDD (major depressive disorder), recurrent severe, without psychosis ([place]) 11/09/2018  . Polysubstance abuse ([place])   . Altered mental status   . Acute encephalopathy 11/09/2017  . Mild renal insufficiency 11/09/2017  . Anemia 11/09/2017  . Thrombocytosis ([place]) 11/09/2017  . Nexplanon in place 09/14/2016  . Herpes 08/13/2016  . Other constipation 08/13/2016  . Bipolar 1 disorder, mixed, moderate ([place]) 05/15/2014  . MDD (major depressive disorder), recurrent episode, severe ([place])  11/21/2013  . ADHD (attention deficit hyperactivity disorder), combined type 11/21/2013    Current Outpatient Medications on File Prior to Visit  Medication Sig Dispense Refill  . ferrous fumarate (HEMOCYTE - 106 MG FE) 325 (106 Fe) MG TABS tablet Take 1 tablet (106 mg of iron total) by mouth daily. 30 tablet 3  . valACYclovir (VALTREX) 1000 MG tablet Take 1 tablet (1,000 mg total) by mouth daily. 30 tablet 11  . Vitamin D, Ergocalciferol, (DRISDOL) 1.25 MG (50000 UNIT) CAPS capsule Take 1 capsule (50,000 Units total) by mouth every 7 (seven) days. 8 capsule 0   No current facility-administered medications on file prior to visit.    Allergies  Allergen Reactions  . Fish Allergy Itching    Throat itching  . Shellfish Allergy Itching    Throat itching    Physical Exam:    Vitals:   01/25/20 1008  BP: 114/77  Pulse: 98  Weight: 223 lb 9.6 oz (101.4 kg)  Height: 5' 1.52" (1.563 m)    Blood pressure percentiles are not available for patients who are 18 years or older.  Physical Exam Vitals and nursing note reviewed.  Constitutional:      General: She is not in acute distress.    Appearance: She is well-developed.  Neck:     Thyroid: No thyromegaly.  Cardiovascular:     Rate and Rhythm: Normal rate and regular rhythm.     Heart sounds: No murmur.  Pulmonary:     Breath sounds: Normal breath sounds.  Abdominal:     Palpations: Abdomen is soft. There is no mass.  Tenderness: There is no abdominal tenderness. There is no guarding.  Musculoskeletal:     Right lower leg: No edema.     Left lower leg: No edema.  Lymphadenopathy:     Cervical: No cervical adenopathy.  Skin:    General: Skin is warm.     Findings: No rash.  Neurological:     Mental Status: She is alert.     Comments: No tremor     Assessment/Plan: 1. History of unprotected sex Agrees to keep EC at her house if she does not use a condom. Is not interested in another form of contraception yet.  Discussed adding PNV.  - ulipristal acetate (ELLA) 30 MG tablet; Take 1 tablet (30 mg total) by mouth once for 1 dose.  Dispense: 1 tablet; Refill: 1  2. Herpes Hx of HSV-2, discussed suppressive therapy.   3. HSV-2 (herpes simplex virus 2) infection As above. No current outbreak.  - valACYclovir (VALTREX) 1000 MG tablet; Take 1 tablet (1,000 mg total) by mouth daily.  Dispense: 90 tablet; Refill: 2  4. Routine screening for STI (sexually transmitted infection) Re-screen today.  - Urine cytology allary only  5. Pregnancy examination or test, negative result Negative. Will continue to monitor. - POCT urine pregnancy  Jonathon Resides, FNP

## 2020-01-25 NOTE — Patient Instructions (Addendum)
If you haven't had a period by around July 4th, please let us know.   Ulipristal oral tablets What is this medicine? ULIPRISTAL (UE li pris tal) is an emergency contraceptive. It prevents pregnancy if taken within 5 days (120 hours) after your regular birth control fails or you have unprotected sex. This medicine will not work if you are already pregnant. This medicine may be used for other purposes; ask your health care provider or pharmacist if you have questions. COMMON BRAND NAME(S): ella What should I tell my health care provider before I take this medicine? They need to know if you have any of these conditions:  liver disease  an unusual or allergic reaction to ulipristal, other medicines, foods, dyes, or preservatives  pregnant or trying to get pregnant  breast-feeding How should I use this medicine? Take this medicine by mouth with or without food. Your doctor may want you to use a quick-response pregnancy test prior to using the tablets. Take your medicine as soon as possible and not more than 5 days (120 hours) after the event. This medicine can be taken at any time during your menstrual cycle. Follow the dose instructions of your health care provider exactly. Contact your health care provider right away if you vomit within 3 hours of taking your medicine to discuss if you need to take another tablet. A patient package insert for the product will be given with each prescription and refill. Read this sheet carefully each time. The sheet may change frequently. Contact your pediatrician regarding the use of this medicine in children. Special care may be needed. Overdosage: If you think you have taken too much of this medicine contact a poison control center or emergency room at once. NOTE: This medicine is only for you. Do not share this medicine with others. What if I miss a dose? This medicine is not for regular use. If you vomit within 3 hours of taking your dose, contact your  health care professional for instructions. What may interact with this medicine? This medicine may interact with the following medications:  barbiturates such as phenobarbital or primidone  birth control pills  bosentan  carbamazepine  certain medicines for fungal infections like griseofulvin, itraconazole, and ketoconazole  certain medicines for HIV or AIDS or hepatitis  dabigatran  digoxin  felbamate  fexofenadine  oxcarbazepine  phenytoin  rifampin  St. John's Wort  topiramate This list may not describe all possible interactions. Give your health care provider a list of all the medicines, herbs, non-prescription drugs, or dietary supplements you use. Also tell them if you smoke, drink alcohol, or use illegal drugs. Some items may interact with your medicine. What should I watch for while using this medicine? Your period may begin a few days earlier or later than expected. If your period is more than 7 days late, pregnancy is possible. See your health care provider as soon as you can and get a pregnancy test. Talk to your healthcare provider before taking this medicine if you know or suspect that you are pregnant. Contact your healthcare provider if you think you may be pregnant and you have taken this medicine. If you have severe abdominal pain about 3 to 5 weeks after taking this medicine, you may have a pregnancy outside the womb, which is called an ectopic or tubal pregnancy. Call your health care provider or go to the nearest emergency room right away if you think this is happening. Discuss birth control options with your health care provider.  Emergency birth control is not to be used routinely to prevent pregnancy. It should not be used more than once in the same cycle. Birth control pills may not work properly while you are taking this medicine. Wait at least 5 days after taking this medicine to start or continue other hormone based birth control. Be sure to use a  reliable barrier contraceptive method (such as a condom with spermicide) between the time you take this medicine and your next period. This medicine does not protect you against HIV infection (AIDS) or any other sexually transmitted diseases (STDs). What side effects may I notice from receiving this medicine? Side effects that you should report to your doctor or health care professional as soon as possible:  allergic reactions like skin rash, itching or hives, swelling of the face, lips, or tongue Side effects that usually do not require medical attention (report to your doctor or health care professional if they continue or are bothersome):  abdominal pain or cramping  dizziness  headache  nausea  spotting  tiredness This list may not describe all possible side effects. Call your doctor for medical advice about side effects. You may report side effects to FDA at 1-800-FDA-1088. Where should I keep my medicine? Keep out of the reach of children. Store at between 20 and 25 degrees C (68 and 77 degrees F). Protect from light and keep in the blister card inside the original box until you are ready to take it. Throw away any unused medicine after the expiration date. NOTE: This sheet is a summary. It may not cover all possible information. If you have questions about this medicine, talk to your doctor, pharmacist, or health care provider.  2020 Elsevier/Gold Standard (2017-01-01 14:27:59)

## 2020-01-26 LAB — URINE CYTOLOGY ANCILLARY ONLY: Candida vaginitis: NEGATIVE

## 2020-01-30 LAB — POCT URINE PREGNANCY: Preg Test, Ur: NEGATIVE

## 2020-01-31 ENCOUNTER — Other Ambulatory Visit: Payer: Self-pay | Admitting: Pediatrics

## 2020-01-31 LAB — URINE CYTOLOGY ANCILLARY ONLY
Bacterial Vaginitis-Urine: POSITIVE — AB
Bacterial Vaginitis-Urine: POSITIVE — AB
Bacterial Vaginitis-Urine: POSITIVE — AB
Candida Urine: NEGATIVE
Chlamydia: NEGATIVE
Comment: NEGATIVE
Comment: NEGATIVE
Comment: NORMAL
Neisseria Gonorrhea: NEGATIVE
Trichomonas: NEGATIVE

## 2020-01-31 MED ORDER — METRONIDAZOLE 500 MG PO TABS: 500.0000 mg | ORAL_TABLET | Freq: Two times a day (BID) | ORAL | 0 refills | Status: AC

## 2020-02-27 ENCOUNTER — Ambulatory Visit: Payer: Medicaid Other | Admitting: Pediatrics

## 2020-05-16 ENCOUNTER — Other Ambulatory Visit (HOSPITAL_COMMUNITY)
Admission: RE | Admit: 2020-05-16 | Discharge: 2020-05-16 | Disposition: A | Discharge status: Home / Self Care | Payer: Medicaid Other | Source: Ambulatory Visit | Attending: Pediatrics | Admitting: Pediatrics

## 2020-05-16 ENCOUNTER — Encounter: Payer: Self-pay | Admitting: Pediatrics

## 2020-05-16 ENCOUNTER — Ambulatory Visit (INDEPENDENT_AMBULATORY_CARE_PROVIDER_SITE_OTHER): Payer: Medicaid Other | Admitting: Pediatrics

## 2020-05-16 VITALS — BP 122/82 | HR 75 | Ht 61.61 in | Wt 220.8 lb

## 2020-05-16 DIAGNOSIS — D500 Iron deficiency anemia secondary to blood loss (chronic): Secondary | ICD-10-CM

## 2020-05-16 DIAGNOSIS — Z113 Encounter for screening for infections with a predominantly sexual mode of transmission: Secondary | ICD-10-CM

## 2020-05-16 DIAGNOSIS — N921 Excessive and frequent menstruation with irregular cycle: Secondary | ICD-10-CM

## 2020-05-16 DIAGNOSIS — F3162 Bipolar disorder, current episode mixed, moderate: Secondary | ICD-10-CM

## 2020-05-16 LAB — POCT URINE PREGNANCY: Preg Test, Ur: NEGATIVE

## 2020-05-16 MED ORDER — FERROUS FUMARATE 325 (106 FE) MG PO TABS: 1.0000 | ORAL_TABLET | Freq: Every day | ORAL | 3 refills | Status: DC

## 2020-05-16 MED ORDER — VITAMIN D (ERGOCALCIFEROL) 1.25 MG (50000 UNIT) PO CAPS: 50000.0000 [IU] | ORAL_CAPSULE | ORAL | 1 refills | Status: DC

## 2020-05-16 NOTE — Patient Instructions (Signed)
Adult Primary Care Clinics Name Criteria Services   [facility]  Address: [address] [place], [place] [address]  Phone: [phone] Hours: Monday - Friday 9 AM -6 PM  Types of insurance accepted:  Commercial insurance [facility] (orange card) Medicaid Medicare Uninsured  Language services:  Video and phone interpreters available   Ages 18 and older    Adult primary care Onsite pharmacy Integrated behavioral health Financial assistance counseling Walk-in hours for established patients  Financial assistance counseling hours: Tuesdays 2:00PM - 5:00PM  Thursday 8:30AM - 4:30PM  Space is limited, 10 on Tuesday and 20 on Thursday. It's on first come first serve basis  Name Criteria Services   [facility]  Address: [address] [place], [place] [address]  Phone: [phone]  Hours: Monday - Friday 8:30 AM - 5 PM  Types of insurance accepted:  Commercial insurance Medicaid Medicare Uninsured  Language services:  Video and phone interpreters available   All ages - newborn to adult   Primary care for all ages (children and adults) Integrated behavioral health Nutritionist Financial assistance counseling   Name Criteria Services   [facility]  Located on the ground floor of [facility]  Address: [address]  [place],  [place]  [address]  Phone: [phone]  Hours: Monday - Friday 8:15 AM - 5 PM  Types of insurance accepted:  Commercial insurance Medicaid Medicare Uninsured  Language services:  Video and phone interpreters available   Ages 18 and older   Adult primary care Nutritionist Certified Diabetes Educator  Integrated behavioral health Financial assistance counseling   Name Criteria Services   [facility]  Address: [address] , [place] [address]  Phone:  [phone]  Hours: Monday - Friday 8:30 AM - 5 PM    Types of insurance accepted:  Commercial insurance Medicaid Medicare Uninsured  Language services:  Video and phone interpreters available   All ages - newborn to adult   Primary care for all ages (children and adults) Integrated behavioral health Financial assistance counseling    

## 2020-05-16 NOTE — Progress Notes (Signed)
History was provided by the patient.  Lacey [name] is a 19 y.o. female who is here for irregular menstrual cycle.  No primary care provider on file.   HPI:  Pt reports that nothing has been up since last visit. She is back today for a check up for STIs.   Period has been irregular. Last month she skipped her period. They can be heavy. She is on cycle now, 5th day, she will have to wear a tampon and pad. She had significant cramping before she started her period. She was about 10 days late this time. Some small clotting.   Has not needed Festus Holts since the last visit.   Takes valtrex when she feels like she is going to have an outbreak, and is currently taking because she had one lesion that came up.   Patient's last menstrual period was 05/11/2020 (approximate).  Review of Systems  Constitutional: Negative for malaise/fatigue.  Eyes: Negative for double vision.  Respiratory: Negative for shortness of breath.   Cardiovascular: Negative for chest pain and palpitations.  Gastrointestinal: Negative for abdominal pain, constipation, diarrhea, nausea and vomiting.  Genitourinary: Negative for dysuria.  Musculoskeletal: Negative for joint pain and myalgias.  Skin: Negative for rash.  Neurological: Negative for dizziness and headaches.  Endo/Heme/Allergies: Does not bruise/bleed easily.    Patient Active Problem List   Diagnosis Date Noted  . Class 2 obesity due to excess calories in adult 11/27/2019  . Menorrhagia with regular cycle 11/27/2019  . Polysubstance dependence ([place])   . Substance induced mood disorder ([place])   . MDD (major depressive disorder), recurrent severe, without psychosis ([place]) 11/09/2018  . Polysubstance abuse ([place])   . Altered mental status   . Acute encephalopathy 11/09/2017  . Mild renal insufficiency 11/09/2017  . Anemia 11/09/2017  . Thrombocytosis ([place]) 11/09/2017  . Nexplanon in place 09/14/2016  . Herpes 08/13/2016  . Other constipation 08/13/2016  .  Bipolar 1 disorder, mixed, moderate ([place]) 05/15/2014  . MDD (major depressive disorder), recurrent episode, severe ([place]) 11/21/2013  . ADHD (attention deficit hyperactivity disorder), combined type 11/21/2013    Current Outpatient Medications on File Prior to Visit  Medication Sig Dispense Refill  . valACYclovir (VALTREX) 1000 MG tablet Take 1 tablet (1,000 mg total) by mouth daily. 90 tablet 2  . ferrous fumarate (HEMOCYTE - 106 MG FE) 325 (106 Fe) MG TABS tablet Take 1 tablet (106 mg of iron total) by mouth daily. (Patient not taking: Reported on 05/16/2020) 30 tablet 3  . Vitamin D, Ergocalciferol, (DRISDOL) 1.25 MG (50000 UNIT) CAPS capsule Take 1 capsule (50,000 Units total) by mouth every 7 (seven) days. (Patient not taking: Reported on 05/16/2020) 8 capsule 0   No current facility-administered medications on file prior to visit.    Allergies  Allergen Reactions  . Fish Allergy Itching    Throat itching  . Shellfish Allergy Itching    Throat itching    Physical Exam:    Vitals:   05/16/20 1120  BP: 122/82  Pulse: 75  Weight: 220 lb 12.8 oz (100.2 kg)  Height: 5' 1.61" (1.565 m)    Blood pressure percentiles are not available for patients who are 18 years or older.  Physical Exam Vitals and nursing note reviewed.  Constitutional:      General: She is not in acute distress.    Appearance: She is well-developed.  Neck:     Thyroid: No thyromegaly.  Cardiovascular:     Rate and Rhythm: Normal rate and  regular rhythm.     Heart sounds: No murmur heard.   Pulmonary:     Breath sounds: Normal breath sounds.  Abdominal:     Palpations: Abdomen is soft. There is no mass.     Tenderness: There is no abdominal tenderness. There is no guarding.  Musculoskeletal:     Right lower leg: No edema.     Left lower leg: No edema.  Lymphadenopathy:     Cervical: No cervical adenopathy.  Skin:    General: Skin is warm.     Findings: No rash.  Neurological:     Mental Status:  She is alert.     Comments: No tremor     Assessment/Plan: 1. Menorrhagia with irregular cycle Will eval for etiology of irregular cycles- seems likely PCOS but will r/o other causes.  - CBC with Differential/Platelet - Hemoglobin L8G - Follicle stimulating hormone - DHEA-sulfate - Testos,Total,Free and SHBG (Female) - Luteinizing hormone - Prolactin - TSH + free T4  2. Iron deficiency anemia due to chronic blood loss Recommend continuing iron.   3. Bipolar 1 disorder, mixed, moderate (HCC) Currently stable without medications.   4. Routine screening for STI (sexually transmitted infection) Repeat STI labs and treat as appropriate.  - Urine cytology ancillary only - POCT urine pregnancy - RPR - HIV Antibody (routine testing w rflx) - WET PREP BY MOLECULAR PROBE  F/u in 2 weeks to review labs. Adult primary care clinic options provided.   Jonathon Resides, FNP

## 2020-05-17 LAB — WET PREP BY MOLECULAR PROBE
Candida species: NOT DETECTED
MICRO NUMBER:: 10959029
SPECIMEN QUALITY:: ADEQUATE
Trichomonas vaginosis: NOT DETECTED

## 2020-05-17 LAB — URINE CYTOLOGY ANCILLARY ONLY
Chlamydia: NEGATIVE
Comment: NEGATIVE
Comment: NORMAL
Neisseria Gonorrhea: NEGATIVE

## 2020-05-21 ENCOUNTER — Other Ambulatory Visit: Payer: Self-pay | Admitting: Pediatrics

## 2020-05-21 LAB — LUTEINIZING HORMONE: LH: 3.4 m[IU]/mL

## 2020-05-21 LAB — TESTOS,TOTAL,FREE AND SHBG (FEMALE)
Free Testosterone: 7.8 pg/mL — ABNORMAL HIGH (ref 0.1–6.4)
Sex Hormone Binding: 21 nmol/L (ref 17–124)
Testosterone, Total, LC-MS-MS: 36 ng/dL (ref 2–45)

## 2020-05-21 LAB — HEMOGLOBIN A1C
Hgb A1c MFr Bld: 5.4 % of total Hgb (ref <5.7)
Mean Plasma Glucose: 108 (calc)
eAG (mmol/L): 6 (calc)

## 2020-05-21 LAB — CBC WITH DIFFERENTIAL/PLATELET
Absolute Monocytes: 429 cells/uL (ref 200–950)
Basophils Absolute: 32 cells/uL (ref 0–200)
Basophils Relative: 0.5 %
Eosinophils Absolute: 230 cells/uL (ref 15–500)
Eosinophils Relative: 3.6 %
HCT: 41 % (ref 35.0–45.0)
Hemoglobin: 13.2 g/dL (ref 11.7–15.5)
Lymphs Abs: 3264 cells/uL (ref 850–3900)
MCH: 25.3 pg — ABNORMAL LOW (ref 27.0–33.0)
MCHC: 32.2 g/dL (ref 32.0–36.0)
MCV: 78.5 fL — ABNORMAL LOW (ref 80.0–100.0)
MPV: 10.6 fL (ref 7.5–12.5)
Monocytes Relative: 6.7 %
Neutro Abs: 2445 cells/uL (ref 1500–7800)
Neutrophils Relative %: 38.2 %
Platelets: 330 10*3/uL (ref 140–400)
RBC: 5.22 10*6/uL — ABNORMAL HIGH (ref 3.80–5.10)
RDW: 15.1 % — ABNORMAL HIGH (ref 11.0–15.0)
Total Lymphocyte: 51 %
WBC: 6.4 10*3/uL (ref 3.8–10.8)

## 2020-05-21 LAB — DHEA-SULFATE: DHEA-SO4: 425 ug/dL — ABNORMAL HIGH (ref 51–321)

## 2020-05-21 LAB — HIV ANTIBODY (ROUTINE TESTING W REFLEX): HIV 1&2 Ab, 4th Generation: NONREACTIVE

## 2020-05-21 LAB — RPR: RPR Ser Ql: NONREACTIVE

## 2020-05-21 LAB — PROLACTIN: Prolactin: 10.6 ng/mL

## 2020-05-21 LAB — FOLLICLE STIMULATING HORMONE: FSH: 7.4 m[IU]/mL

## 2020-05-21 LAB — TSH+FREE T4: TSH W/REFLEX TO FT4: 0.55 mIU/L

## 2020-05-21 MED ORDER — METRONIDAZOLE 500 MG PO TABS: 500.0000 mg | ORAL_TABLET | Freq: Two times a day (BID) | ORAL | 0 refills | Status: AC

## 2020-05-30 ENCOUNTER — Ambulatory Visit: Payer: Medicaid Other | Admitting: Pediatrics

## 2020-07-06 ENCOUNTER — Emergency Department (HOSPITAL_COMMUNITY)
Admission: EM | Admit: 2020-07-06 | Discharge: 2020-07-06 | Disposition: A | Discharge status: Home / Self Care | Payer: Medicaid Other | Attending: Emergency Medicine | Admitting: Emergency Medicine

## 2020-07-06 ENCOUNTER — Other Ambulatory Visit: Payer: Self-pay

## 2020-07-06 ENCOUNTER — Encounter (HOSPITAL_COMMUNITY): Payer: Self-pay | Admitting: Emergency Medicine

## 2020-07-06 DIAGNOSIS — R197 Diarrhea, unspecified: Secondary | ICD-10-CM | POA: Diagnosis not present

## 2020-07-06 DIAGNOSIS — R1084 Generalized abdominal pain: Secondary | ICD-10-CM | POA: Insufficient documentation

## 2020-07-06 DIAGNOSIS — O99511 Diseases of the respiratory system complicating pregnancy, first trimester: Secondary | ICD-10-CM | POA: Insufficient documentation

## 2020-07-06 DIAGNOSIS — O99331 Smoking (tobacco) complicating pregnancy, first trimester: Secondary | ICD-10-CM | POA: Diagnosis not present

## 2020-07-06 DIAGNOSIS — O26891 Other specified pregnancy related conditions, first trimester: Secondary | ICD-10-CM | POA: Diagnosis not present

## 2020-07-06 DIAGNOSIS — J45909 Unspecified asthma, uncomplicated: Secondary | ICD-10-CM | POA: Diagnosis not present

## 2020-07-06 DIAGNOSIS — Z3491 Encounter for supervision of normal pregnancy, unspecified, first trimester: Secondary | ICD-10-CM

## 2020-07-06 DIAGNOSIS — F17210 Nicotine dependence, cigarettes, uncomplicated: Secondary | ICD-10-CM | POA: Insufficient documentation

## 2020-07-06 DIAGNOSIS — Z3A01 Less than 8 weeks gestation of pregnancy: Secondary | ICD-10-CM | POA: Diagnosis not present

## 2020-07-06 DIAGNOSIS — O219 Vomiting of pregnancy, unspecified: Secondary | ICD-10-CM | POA: Insufficient documentation

## 2020-07-06 LAB — URINALYSIS, ROUTINE W REFLEX MICROSCOPIC
Bilirubin Urine: NEGATIVE
Glucose, UA: NEGATIVE mg/dL
Hgb urine dipstick: NEGATIVE
Ketones, ur: NEGATIVE mg/dL
Leukocytes,Ua: NEGATIVE
Nitrite: NEGATIVE
Protein, ur: NEGATIVE mg/dL
Specific Gravity, Urine: 1.021 (ref 1.005–1.030)
pH: 8 (ref 5.0–8.0)

## 2020-07-06 LAB — COMPREHENSIVE METABOLIC PANEL
ALT: 18 U/L (ref 0–44)
AST: 16 U/L (ref 15–41)
Albumin: 4.3 g/dL (ref 3.5–5.0)
Alkaline Phosphatase: 53 U/L (ref 38–126)
Anion gap: 10 (ref 5–15)
BUN: <5 mg/dL — ABNORMAL LOW (ref 6–20)
CO2: 21 mmol/L — ABNORMAL LOW (ref 22–32)
Calcium: 9.7 mg/dL (ref 8.9–10.3)
Chloride: 105 mmol/L (ref 98–111)
Creatinine, Ser: 0.84 mg/dL (ref 0.44–1.00)
GFR, Estimated: >60 mL/min (ref >60)
Glucose, Bld: 132 mg/dL — ABNORMAL HIGH (ref 70–99)
Potassium: 4.6 mmol/L (ref 3.5–5.1)
Sodium: 136 mmol/L (ref 135–145)
Total Bilirubin: 0.4 mg/dL (ref 0.3–1.2)
Total Protein: 7.2 g/dL (ref 6.5–8.1)

## 2020-07-06 LAB — CBC
HCT: 41.9 % (ref 36.0–46.0)
Hemoglobin: 13.2 g/dL (ref 12.0–15.0)
MCH: 25.2 pg — ABNORMAL LOW (ref 26.0–34.0)
MCHC: 31.5 g/dL (ref 30.0–36.0)
MCV: 80 fL (ref 80.0–100.0)
Platelets: 336 10*3/uL (ref 150–400)
RBC: 5.24 MIL/uL — ABNORMAL HIGH (ref 3.87–5.11)
RDW: 14.8 % (ref 11.5–15.5)
WBC: 8.8 10*3/uL (ref 4.0–10.5)
nRBC: 0 % (ref 0.0–0.2)

## 2020-07-06 LAB — I-STAT BETA HCG BLOOD, ED (MC, WL, AP ONLY): I-stat hCG, quantitative: >2000 m[IU]/mL — ABNORMAL HIGH (ref <5)

## 2020-07-06 LAB — LIPASE, BLOOD: Lipase: 21 U/L (ref 11–51)

## 2020-07-06 MED ORDER — UNISOM SLEEPTABS 25 MG PO TABS
25.0000 mg | ORAL_TABLET | Freq: Every evening | ORAL | 0 refills | Status: DC | PRN
Start: 2020-07-06 — End: 2020-08-14

## 2020-07-06 MED ORDER — LACTATED RINGERS IV BOLUS
1000.0000 mL | Freq: Once | INTRAVENOUS | Status: AC
  Administered 2020-07-06: 1000 mL via INTRAVENOUS

## 2020-07-06 NOTE — Discharge Instructions (Signed)
Make sure you are always snacking on something take keep something on your stomach so the nausea is improved.  If you start having severe abdominal pain, fever, heavy vaginal bleeding or other concerns please return to either women's hospital or the emergency room.  Call women's clinic for follow-up appointment.  They should be able to see all the testing you had done here today.  You can take Unisom at night when you go to bed and night should help with your daytime morning sickness.

## 2020-07-06 NOTE — ED Triage Notes (Signed)
Pt. Stated, Lacey [name] not been able to keep anything down for 3 days.

## 2020-07-06 NOTE — ED Notes (Signed)
Patient Alert and oriented to baseline. Stable and ambulatory to baseline. Patient verbalized understanding of the discharge instructions.  Patient belongings were taken by the patient.   

## 2020-07-06 NOTE — ED Provider Notes (Signed)
[facility] EMERGENCY DEPARTMENT Provider Note   CSN: [id number] Arrival date & time: 07/06/20  1135     History Chief Complaint  Patient presents with  . Abdominal Pain  . Emesis  . Nausea    Lacey GIOVANNETTI is a 19 y.o. female.  The history is provided by the patient.  Abdominal Pain Pain location:  Generalized Pain quality: cramping   Pain radiates to:  Does not radiate Pain severity:  Mild Onset quality:  Gradual Duration:  3 days Timing:  Intermittent Progression:  Waxing and waning Chronicity:  New Context: recent sexual activity   Context: not diet changes, not recent illness and not suspicious food intake   Relieved by:  Nothing Worsened by:  Nothing Ineffective treatments:  None tried Associated symptoms: diarrhea, nausea and vomiting   Associated symptoms: no chest pain, no constipation, no cough, no shortness of breath, no vaginal bleeding and no vaginal discharge   Risk factors comment:  Current history of 2 sexual partners and does not use protection regularly, prior history of herpes, gonorrhea and chlamydia that have been treated in the past, asthma, recent cocaine use Emesis Associated symptoms: abdominal pain and diarrhea   Associated symptoms: no cough        Past Medical History:  Diagnosis Date  . ADHD (attention deficit hyperactivity disorder)   . Allergy    seasonal  . Asthma   . Asthma   . Depression   . Mood disorder ([place])   . Obesity   . ODD (oppositional defiant disorder)   . Vision abnormalities    needs glasses    Patient Active Problem List   Diagnosis Date Noted  . Class 2 obesity due to excess calories in adult 11/27/2019  . Menorrhagia with irregular cycle 11/27/2019  . Polysubstance dependence ([place])   . Substance induced mood disorder ([place])   . MDD (major depressive disorder), recurrent severe, without psychosis ([place]) 11/09/2018  . Polysubstance abuse ([place])   . Altered mental status   . Acute  encephalopathy 11/09/2017  . Mild renal insufficiency 11/09/2017  . Anemia 11/09/2017  . Thrombocytosis 11/09/2017  . Nexplanon in place 09/14/2016  . Herpes 08/13/2016  . Other constipation 08/13/2016  . Bipolar 1 disorder, mixed, moderate ([place]) 05/15/2014  . MDD (major depressive disorder), recurrent episode, severe ([place]) 11/21/2013  . ADHD (attention deficit hyperactivity disorder), combined type 11/21/2013    Past Surgical History:  Procedure Laterality Date  . FRACTURE SURGERY    . HARDWARE REMOVAL Right 04/10/2014   Procedure: RIGHT ANKLE HARDWARE REMOVAL;  Surgeon: Meredith Pel, MD;  Location: [facility];  Service: Orthopedics;  Laterality: Right;  . ORIF ANKLE FRACTURE Right 12/29/2013   Procedure: OPEN REDUCTION INTERNAL FIXATION (ORIF) RIGHT ANKLE FRACTURE AND SYNDESMOTIC FIXATION.;  Surgeon: Meredith Pel, MD;  Location: [facility];  Service: Orthopedics;  Laterality: Right;  RIGHT ANKLE FRACTURE AND SYNDESMOTIC FIXATION.     OB History   No obstetric history on file.     Family History  Problem Relation Age of Onset  . Cancer Sister     Social History   Tobacco Use  . Smoking status: Heavy Tobacco Smoker    Years: 2.00    Types: Cigarettes  . Smokeless tobacco: Never Used  . Tobacco comment: mom smokes and says pt does too  Vaping Use  . Vaping Use: Never used  Substance Use Topics  . Alcohol use: Yes    Comment: Has used "a lot"  .  Drug use: Yes    Frequency: 1.0 times per week    Types: "Crack" cocaine, Cocaine, Other-see comments, Amphetamines, Methamphetamines, Marijuana    Comment: Admits to THC, crack, heroin; no UDS available    Home Medications Prior to Admission medications   Medication Sig Start Date End Date Taking? Authorizing Provider  ferrous fumarate (HEMOCYTE - 106 MG FE) 325 (106 Fe) MG TABS tablet Take 1 tablet (106 mg of iron total) by mouth daily. 05/16/20   Trude Mcburney, FNP  valACYclovir (VALTREX) 1000 MG tablet Take 1 tablet  (1,000 mg total) by mouth daily. 01/25/20 01/24/21  Trude Mcburney, FNP  Vitamin D, Ergocalciferol, (DRISDOL) 1.25 MG (50000 UNIT) CAPS capsule Take 1 capsule (50,000 Units total) by mouth every 7 (seven) days. 05/16/20   Trude Mcburney, FNP    Allergies    Fish allergy and Shellfish allergy  Review of Systems   Review of Systems  Respiratory: Negative for cough and shortness of breath.   Cardiovascular: Negative for chest pain.  Gastrointestinal: Positive for abdominal pain, diarrhea, nausea and vomiting. Negative for constipation.  Genitourinary: Negative for vaginal bleeding and vaginal discharge.  All other systems reviewed and are negative.   Physical Exam Updated Vital Signs BP (!) 149/83 (BP Location: Right Arm)   Pulse (!) 125   Temp 98.4 F (36.9 C) (Oral)   Resp 16   Ht 5\' 1"  (1.549 m)   Wt 99.8 kg   LMP 05/06/2020   SpO2 99%   BMI 41.57 kg/m   Physical Exam Vitals and nursing note reviewed.  Constitutional:      General: She is not in acute distress.    Appearance: She is well-developed. She is obese.  HENT:     Head: Normocephalic and atraumatic.  Eyes:     Extraocular Movements: Extraocular movements intact.     Pupils: Pupils are equal, round, and reactive to light.  Cardiovascular:     Rate and Rhythm: Normal rate and regular rhythm.     Heart sounds: Normal heart sounds. No murmur heard.  No friction rub.  Pulmonary:     Effort: Pulmonary effort is normal.     Breath sounds: Normal breath sounds. No wheezing or rales.  Abdominal:     General: Bowel sounds are normal. There is no distension.     Palpations: Abdomen is soft.     Tenderness: There is no abdominal tenderness. There is no right CVA tenderness, guarding or rebound.  Musculoskeletal:        General: No tenderness. Normal range of motion.     Comments: No edema  Skin:    General: Skin is warm and dry.     Findings: No rash.  Neurological:     Mental Status: She is alert and  oriented to person, place, and time.     Cranial Nerves: No cranial nerve deficit.  Psychiatric:        Behavior: Behavior normal.     ED Results / Procedures / Treatments   Labs (all labs ordered are listed, but only abnormal results are displayed) Labs Reviewed  COMPREHENSIVE METABOLIC PANEL - Abnormal; Notable for the following components:      Result Value   CO2 21 (*)    Glucose, Bld 132 (*)    BUN <5 (*)    All other components within normal limits  CBC - Abnormal; Notable for the following components:   RBC 5.24 (*)    MCH 25.2 (*)  All other components within normal limits  I-STAT BETA HCG BLOOD, ED ([facility], [facility], [facility] ONLY) - Abnormal; Notable for the following components:   I-stat hCG, quantitative >2,000.0 (*)    All other components within normal limits  LIPASE, BLOOD  URINALYSIS, ROUTINE W REFLEX MICROSCOPIC    EKG None  Radiology No results found.  EMERGENCY DEPARTMENT [place] PREGNANCY "Study: Limited Ultrasound of the Pelvis for Pregnancy"  INDICATIONS:Pregnancy(required) Multiple views of the uterus and pelvic cavity were obtained in real-time with a multi-frequency probe.  APPROACH:Transabdominal  PERFORMED BY: Myself IMAGES ARCHIVED?: Yes LIMITATIONS: Body habitus PREGNANCY FREE FLUID: None ADNEXAL FINDINGS:Left ovary not seen and Right ovary not seen GESTATIONAL AGE, ESTIMATE: 7 weeks FETAL HEART RATE: flicker INTERPRETATION: Intrauterine gestational sac noted and HR flicker seen     Procedures Procedures (including critical care time)  Medications Ordered in ED Medications  lactated ringers bolus 1,000 mL (has no administration in time range)    ED Course  I have reviewed the triage vital signs and the nursing notes.  Pertinent labs & imaging results that were available during my care of the patient were reviewed by me and considered in my medical decision making (see chart for details).    MDM Rules/Calculators/A&P                            Patient is a 19 year old female presenting today with 3 days of intermittent nausea and vomiting.  She reports she is even vomited up Gatorade at times.  She has intermittent abdominal cramps but denies any vaginal bleeding or discharge.  Patient has no focal abdominal tenderness on exam.  She denies any urinary symptoms.  LMP was in September and she does not use OCPs.  She is sexually active with 2 partners and does not use protection. CBC, CMP and lipase are all within normal limits.  I-STAT hCG today is greater than 2000 and most consistent with pregnancy.  Bedside ultrasound does show a gestational sac with a flicker for heart rate.  Suspect approximately [redacted] weeks pregnant.  Patient given IV fluids.  UA to confirm no evidence of UTI patient will otherwise be stable for discharge with vitamin B and Unisom.  Patient instructed to follow-up with OB/GYN.  Final Clinical Impression(s) / ED Diagnoses Final diagnoses:  None    Rx / DC Orders ED Discharge Orders    None       Blanchie Dessert, MD 07/06/20 1551

## 2020-07-10 ENCOUNTER — Ambulatory Visit (INDEPENDENT_AMBULATORY_CARE_PROVIDER_SITE_OTHER): Payer: Medicaid Other | Admitting: Obstetrics and Gynecology

## 2020-07-10 ENCOUNTER — Ambulatory Visit (INDEPENDENT_AMBULATORY_CARE_PROVIDER_SITE_OTHER): Payer: Medicaid Other

## 2020-07-10 ENCOUNTER — Other Ambulatory Visit: Payer: Self-pay

## 2020-07-10 VITALS — BP 113/75 | HR 73 | Ht 61.0 in | Wt 212.3 lb

## 2020-07-10 DIAGNOSIS — O3680X0 Pregnancy with inconclusive fetal viability, not applicable or unspecified: Secondary | ICD-10-CM

## 2020-07-10 DIAGNOSIS — O30001 Twin pregnancy, unspecified number of placenta and unspecified number of amniotic sacs, first trimester: Secondary | ICD-10-CM

## 2020-07-10 DIAGNOSIS — Z789 Other specified health status: Secondary | ICD-10-CM

## 2020-07-10 DIAGNOSIS — Z3A01 Less than 8 weeks gestation of pregnancy: Secondary | ICD-10-CM | POA: Diagnosis not present

## 2020-07-10 DIAGNOSIS — F1990 Other psychoactive substance use, unspecified, uncomplicated: Secondary | ICD-10-CM

## 2020-07-10 DIAGNOSIS — Z3491 Encounter for supervision of normal pregnancy, unspecified, first trimester: Secondary | ICD-10-CM

## 2020-07-10 DIAGNOSIS — O0991 Supervision of high risk pregnancy, unspecified, first trimester: Secondary | ICD-10-CM | POA: Insufficient documentation

## 2020-07-10 DIAGNOSIS — O219 Vomiting of pregnancy, unspecified: Secondary | ICD-10-CM

## 2020-07-10 MED ORDER — PREPLUS 27-1 MG PO TABS: 1.0000 | ORAL_TABLET | Freq: Every day | ORAL | 13 refills | Status: DC

## 2020-07-10 MED ORDER — BLOOD PRESSURE KIT DEVI: 1.0000 | 0 refills | Status: AC

## 2020-07-10 MED ORDER — DOXYLAMINE-PYRIDOXINE 10-10 MG PO TBEC: 2.0000 | DELAYED_RELEASE_TABLET | Freq: Every day | ORAL | 5 refills | Status: DC

## 2020-07-10 NOTE — Progress Notes (Signed)
PRENATAL INTAKE SUMMARY  Ms. Demorest presents today New OB Nurse Interview.  OB History    Gravida  1   Para      Term      Preterm      AB      Living        SAB      TAB      Ectopic      Multiple      Live Births             I have reviewed the patient's medical, obstetrical, social, and family histories, medications, and available lab results.  SUBJECTIVE She has no unusual complaints. Patient admits to using THC daily and using cocaine within the last month. She denies using any other drugs at this time. She is working on discontinuing cigarette use and THC.   OBJECTIVE Initial Physical Exam (New OB)  GENERAL APPEARANCE: alert, well appearing, anxious   ASSESSMENT Normal pregnancy  PLAN Prenatal care to be completed at [facility] New OB labs will be completed at New OB provider visit Baby scripts ordered Blood pressure kit ordered U/S performed today reveals live twin IUP at [redacted]w[redacted]d Baby A FHR 135. [redacted]w[redacted]d Baby B FHR 117 PHQ 9 score: 8 GAD 7 score: 14 Diclegis sent to the pharmacy for nausea and vomiting  

## 2020-07-10 NOTE — Progress Notes (Signed)
GYNECOLOGY OFFICE VISIT NOTE  History:  19 y.o. G1P0 here today for OB intake. Seen at request of RN doing ultrasound to confirm twins. Twin pregnancy confirmed with 2 embryo measuring 89w3dand 682w4dith cardiac activity in both within one gestational sac. Two yolk sacs noted.   Past Medical History:  Diagnosis Date  . ADHD (attention deficit hyperactivity disorder)   . Allergy    seasonal  . Asthma   . Asthma   . Depression   . Mood disorder ([place]  . Obesity   . ODD (oppositional defiant disorder)   . Vision abnormalities    needs glasses    Past Surgical History:  Procedure Laterality Date  . FRACTURE SURGERY    . HARDWARE REMOVAL Right 04/10/2014   Procedure: RIGHT ANKLE HARDWARE REMOVAL;  Surgeon: GrMeredith PelMD;  [facility] Service: Orthopedics;  Laterality: Right;  . ORIF ANKLE FRACTURE Right 12/29/2013   Procedure: OPEN REDUCTION INTERNAL FIXATION (ORIF) RIGHT ANKLE FRACTURE AND SYNDESMOTIC FIXATION.;  Surgeon: GrMeredith PelMD;  [facility]: [facility];  Laterality: Right;  RIGHT ANKLE FRACTURE AND SYNDESMOTIC FIXATION.     Current Outpatient Medications:  .  doxylamine, Sleep, (UNISOM SLEEPTABS) 25 MG tablet, Take 1 tablet (25 mg total) by mouth at bedtime as needed (for morning sickness)., Disp: 30 tablet, Rfl: 0 .  ferrous fumarate (HEMOCYTE - 106 MG FE) 325 (106 Fe) MG TABS tablet, Take 1 tablet (106 mg of iron total) by mouth daily., Disp: 30 tablet, Rfl: 3 .  valACYclovir (VALTREX) 1000 MG tablet, Take 1 tablet (1,000 mg total) by mouth daily., Disp: 90 tablet, Rfl: 2 .  Blood Pressure Monitoring (BLOOD PRESSURE KIT) DEVI, 1 kit by Does not apply route once a week., Disp: 1 each, Rfl: 0 .  Doxylamine-Pyridoxine (DICLEGIS) 10-10 MG TBEC, Take 2 tablets by mouth at bedtime. If symptoms persist, add one tablet in the morning and one in the afternoon, Disp: 100 tablet, Rfl: 5 .  Prenatal Vit-Fe Fumarate-FA (PREPLUS) 27-1 MG TABS,  Take 1 tablet by mouth daily., Disp: 30 tablet, Rfl: 13  The following portions of the patient's history were reviewed and updated as appropriate: allergies, current medications, past family history, past medical history, past social history, past surgical history and problem list.    Review of Systems:  Pertinent items noted in HPI and remainder of comprehensive ROS otherwise negative.   Objective:  Physical Exam BP 113/75   Pulse 73   Ht '5\' 1"'  (1.549 m)   Wt 212 lb 4.8 oz (96.3 kg)   LMP 05/11/2020   BMI 40.11 kg/m  CONSTITUTIONAL: Well-developed, well-nourished female in no acute distress.  HENT:  Normocephalic, atraumatic. External right and left ear normal. Oropharynx is clear and moist EYES: Conjunctivae and EOM are normal. Pupils are equal, round, and reactive to light. No scleral icterus.  NECK: Normal range of motion, supple, no masses SKIN: Skin is warm and dry. No rash noted. Not diaphoretic. No erythema. No pallor. NEUROLOGIC: Alert and oriented to person, place, and time. Normal reflexes, muscle tone coordination. No cranial nerve deficit noted. PSYCHIATRIC: Normal mood and affect. Normal behavior. Normal judgment and thought content. CARDIOVASCULAR: Normal heart rate noted RESPIRATORY: Effort normal, no problems with respiration noted ABDOMEN: Soft, no distention noted.   PELVIC: deferred MUSCULOSKELETAL: Normal range of motion. No edema noted.  [place] with chaperone present.  [place] gestation, intrauterine, both embryos within one gestational sac,  measuring  18w3dand 651w4dith cardiac activity noted in both, no dividing membrane noted  Labs and Imaging No results found.  Assessment & Plan:  1. Encounter for supervision of normal pregnancy in first trimester, unspecified gravidity - Enroll Patient in Babyscripts - Blood Pressure Monitoring (BLOOD PRESSURE KIT) DEVI; 1 kit by Does not apply route once a week.  Dispense: 1 each; Refill: 0 - Prenatal Vit-Fe  Fumarate-FA (PREPLUS) 27-1 MG TABS; Take 1 tablet by mouth daily.  Dispense: 30 tablet; Refill: 13  2. Last menstrual period (LMP) > 10 days ago - [place]; Future  3. Encounter to determine fetal viability of pregnancy, single or unspecified fetus - twin pregnancy confirmed via [place] reviewed precautions for pregnancy, importance of follow up [place] assess chorionicity and reviewed with patient that level of risk of pregnancy and pregnancy care depends on that assessment, she verbalizes understanding - [place]; Future  4. Nausea and vomiting during pregnancy - Doxylamine-Pyridoxine (DICLEGIS) 10-10 MG TBEC; Take 2 tablets by mouth at bedtime. If symptoms persist, add one tablet in the morning and one in the afternoon  Dispense: 100 tablet; Refill: 5  5. Substance use Pt reports cocaine and MJ use, reviewed risks of continued substance abuse, risks to pregnancy with continued use   Routine preventative health maintenance measures emphasized. Please refer to After Visit Summary for other counseling recommendations.   Return in about 4 weeks (around 08/07/2020) for start prenatal care.   Total face-to-face time with patient: 22 minutes. Over 50% of encounter was spent on counseling and coordination of care.   K.Feliz BeamM.D. Attending Center for [employer]

## 2020-07-12 DIAGNOSIS — H538 Other visual disturbances: Secondary | ICD-10-CM | POA: Diagnosis not present

## 2020-07-24 ENCOUNTER — Ambulatory Visit: Payer: Medicaid Other | Attending: Obstetrics and Gynecology

## 2020-07-24 ENCOUNTER — Ambulatory Visit: Payer: Medicaid Other | Admitting: *Deleted

## 2020-07-24 ENCOUNTER — Encounter: Payer: Self-pay | Admitting: *Deleted

## 2020-07-24 ENCOUNTER — Other Ambulatory Visit: Payer: Self-pay | Admitting: Obstetrics and Gynecology

## 2020-07-24 ENCOUNTER — Ambulatory Visit: Admission: RE | Admit: 2020-07-24 | Payer: Medicaid Other | Source: Ambulatory Visit

## 2020-07-24 ENCOUNTER — Other Ambulatory Visit: Payer: Self-pay

## 2020-07-24 VITALS — BP 124/69 | HR 82

## 2020-07-24 DIAGNOSIS — O30031 Twin pregnancy, monochorionic/diamniotic, first trimester: Secondary | ICD-10-CM | POA: Diagnosis not present

## 2020-07-24 DIAGNOSIS — F99 Mental disorder, not otherwise specified: Secondary | ICD-10-CM | POA: Diagnosis not present

## 2020-07-24 DIAGNOSIS — O30001 Twin pregnancy, unspecified number of placenta and unspecified number of amniotic sacs, first trimester: Secondary | ICD-10-CM | POA: Diagnosis not present

## 2020-07-24 DIAGNOSIS — O99211 Obesity complicating pregnancy, first trimester: Secondary | ICD-10-CM

## 2020-07-24 DIAGNOSIS — O99341 Other mental disorders complicating pregnancy, first trimester: Secondary | ICD-10-CM | POA: Diagnosis not present

## 2020-07-24 DIAGNOSIS — J45909 Unspecified asthma, uncomplicated: Secondary | ICD-10-CM

## 2020-07-24 DIAGNOSIS — O99511 Diseases of the respiratory system complicating pregnancy, first trimester: Secondary | ICD-10-CM | POA: Diagnosis not present

## 2020-07-24 DIAGNOSIS — F1990 Other psychoactive substance use, unspecified, uncomplicated: Secondary | ICD-10-CM

## 2020-07-24 DIAGNOSIS — O99321 Drug use complicating pregnancy, first trimester: Secondary | ICD-10-CM

## 2020-07-24 DIAGNOSIS — Z3A08 8 weeks gestation of pregnancy: Secondary | ICD-10-CM

## 2020-07-24 DIAGNOSIS — E669 Obesity, unspecified: Secondary | ICD-10-CM

## 2020-07-24 DIAGNOSIS — Z3687 Encounter for antenatal screening for uncertain dates: Secondary | ICD-10-CM

## 2020-07-24 DIAGNOSIS — O99331 Smoking (tobacco) complicating pregnancy, first trimester: Secondary | ICD-10-CM

## 2020-07-24 DIAGNOSIS — F17200 Nicotine dependence, unspecified, uncomplicated: Secondary | ICD-10-CM

## 2020-07-29 ENCOUNTER — Other Ambulatory Visit: Payer: Self-pay | Admitting: *Deleted

## 2020-07-29 DIAGNOSIS — O99321 Drug use complicating pregnancy, first trimester: Secondary | ICD-10-CM

## 2020-07-29 DIAGNOSIS — O30009 Twin pregnancy, unspecified number of placenta and unspecified number of amniotic sacs, unspecified trimester: Secondary | ICD-10-CM

## 2020-07-30 ENCOUNTER — Encounter: Payer: Medicaid Other | Admitting: Advanced Practice Midwife

## 2020-08-08 DIAGNOSIS — H5213 Myopia, bilateral: Secondary | ICD-10-CM | POA: Diagnosis not present

## 2020-08-14 ENCOUNTER — Ambulatory Visit (INDEPENDENT_AMBULATORY_CARE_PROVIDER_SITE_OTHER): Payer: Medicaid Other | Admitting: Nurse Practitioner

## 2020-08-14 ENCOUNTER — Other Ambulatory Visit: Payer: Self-pay

## 2020-08-14 ENCOUNTER — Encounter: Payer: Self-pay | Admitting: Obstetrics

## 2020-08-14 ENCOUNTER — Other Ambulatory Visit (HOSPITAL_COMMUNITY)
Admission: RE | Admit: 2020-08-14 | Discharge: 2020-08-14 | Disposition: A | Discharge status: Home / Self Care | Payer: Medicaid Other | Source: Ambulatory Visit | Attending: Advanced Practice Midwife | Admitting: Advanced Practice Midwife

## 2020-08-14 ENCOUNTER — Encounter: Payer: Self-pay | Admitting: Nurse Practitioner

## 2020-08-14 VITALS — BP 124/79 | HR 93 | Wt 208.0 lb

## 2020-08-14 DIAGNOSIS — Z3A11 11 weeks gestation of pregnancy: Secondary | ICD-10-CM | POA: Diagnosis not present

## 2020-08-14 DIAGNOSIS — O219 Vomiting of pregnancy, unspecified: Secondary | ICD-10-CM

## 2020-08-14 DIAGNOSIS — R8271 Bacteriuria: Secondary | ICD-10-CM

## 2020-08-14 DIAGNOSIS — O99210 Obesity complicating pregnancy, unspecified trimester: Secondary | ICD-10-CM

## 2020-08-14 DIAGNOSIS — Z315 Encounter for genetic counseling: Secondary | ICD-10-CM | POA: Diagnosis not present

## 2020-08-14 DIAGNOSIS — O30001 Twin pregnancy, unspecified number of placenta and unspecified number of amniotic sacs, first trimester: Secondary | ICD-10-CM | POA: Insufficient documentation

## 2020-08-14 DIAGNOSIS — Z3481 Encounter for supervision of other normal pregnancy, first trimester: Secondary | ICD-10-CM | POA: Diagnosis not present

## 2020-08-14 DIAGNOSIS — O0991 Supervision of high risk pregnancy, unspecified, first trimester: Secondary | ICD-10-CM

## 2020-08-14 DIAGNOSIS — O0990 Supervision of high risk pregnancy, unspecified, unspecified trimester: Secondary | ICD-10-CM

## 2020-08-14 DIAGNOSIS — F332 Major depressive disorder, recurrent severe without psychotic features: Secondary | ICD-10-CM

## 2020-08-14 DIAGNOSIS — F1990 Other psychoactive substance use, unspecified, uncomplicated: Secondary | ICD-10-CM

## 2020-08-14 DIAGNOSIS — Z8619 Personal history of other infectious and parasitic diseases: Secondary | ICD-10-CM

## 2020-08-14 NOTE — Progress Notes (Signed)
Subjective:   Lacey [name] is a 19 y.o. G1P0 at 40w3dby early ultrasound being seen today for her first obstetrical visit.  Her obstetrical history is significant for twin gestation, hx of substance abuse, hx of severe depression, Hx of HSV-2, . Patient does intend to breast feed. Pregnancy history fully reviewed.  Patient reports vomiting.  HISTORY: OB History  Gravida Para Term Preterm AB Living  1 0 0 0 0 0  SAB IAB Ectopic Multiple Live Births  0 0 0 0 0    # Outcome Date GA Lbr Len/2nd Weight Sex Delivery Anes PTL Lv  1 Current            Past Medical History:  Diagnosis Date  . ADHD (attention deficit hyperactivity disorder)   . Allergy    seasonal  . Asthma   . Asthma   . Depression   . Mood disorder ([place]   . Obesity   . ODD (oppositional defiant disorder)   . Vision abnormalities    needs glasses   Past Surgical History:  Procedure Laterality Date  . FRACTURE SURGERY    . HARDWARE REMOVAL Right 04/10/2014   Procedure: RIGHT ANKLE HARDWARE REMOVAL;  Surgeon: GMeredith Pel MD;  [facility] [facility]  Service: Orthopedics;  Laterality: Right;  . ORIF ANKLE FRACTURE Right 12/29/2013   Procedure: OPEN REDUCTION INTERNAL FIXATION (ORIF) RIGHT ANKLE FRACTURE AND SYNDESMOTIC FIXATION.;  Surgeon: GMeredith Pel MD;  [facility]  Service: Orthopedics;  Laterality: Right;  RIGHT ANKLE FRACTURE AND SYNDESMOTIC FIXATION.   Family History  Problem Relation Age of Onset  . Stroke Mother   . Hypertension Mother   . COPD Father   . Cancer Sister    Social History   Tobacco Use  . Smoking status: Former Smoker    Years: 2.00    Types: Cigarettes  . Smokeless tobacco: Never Used  . Tobacco comment: mom smokes and says pt does too *pt states since pregnancy no longer smoking  Vaping Use  . Vaping Use: Never used  Substance Use Topics  . Alcohol use: Not Currently    Comment: 3-4 months ago  . Drug use: Not Currently    Frequency: 1.0 times per week     Types: "Crack" cocaine, Cocaine, Other-see comments, Amphetamines, Methamphetamines, Marijuana    Comment: Admits to THC, crack, heroin; no UDS available   Allergies  Allergen Reactions  . Fish Allergy Itching    Throat itching  . Shellfish Allergy Itching    Throat itching   Current Outpatient Medications on File Prior to Visit  Medication Sig Dispense Refill  . Blood Pressure Monitoring (BLOOD PRESSURE KIT) DEVI 1 kit by Does not apply route once a week. 1 each 0  . Doxylamine-Pyridoxine (DICLEGIS) 10-10 MG TBEC Take 2 tablets by mouth at bedtime. If symptoms persist, add one tablet in the morning and one in the afternoon 100 tablet 5  . ferrous fumarate (HEMOCYTE - 106 MG FE) 325 (106 Fe) MG TABS tablet Take 1 tablet (106 mg of iron total) by mouth daily. 30 tablet 3  . Prenatal Vit-Fe Fumarate-FA (PREPLUS) 27-1 MG TABS Take 1 tablet by mouth daily. 30 tablet 13  . valACYclovir (VALTREX) 1000 MG tablet Take 1 tablet (1,000 mg total) by mouth daily. 90 tablet 2   No current facility-administered medications on file prior to visit.     Exam   Vitals:   08/14/20 1311  BP: 124/79  Pulse:  93  Weight: 208 lb (94.3 kg)   Fetal Heart Rate (bpm): Rt: 152 Lt:163  Uterus:     Pelvic Exam: Perineum: no hemorrhoids, normal perineum   Vulva: normal external genitalia, no lesions   Vagina:  normal mucosa, normal discharge   Cervix: no lesions and normal,    Adnexa: normal adnexa and no mass, fullness, tenderness   Bony Pelvis: average  System: General: well-developed, well-nourished female in no acute distress   Breast:  normal appearance, no masses or tenderness, dry patches noted on areola - asymptomatic   Skin: normal coloration and turgor, no rashes   Neurologic: oriented, normal, negative, normal mood   Extremities: normal strength, tone, and muscle mass, ROM of all joints is normal   HEENT extraocular movement intact and sclera clear, anicteric   Mouth/Teeth deferred    Neck supple and no masses, normal thyroid   Cardiovascular: regular rate and rhythm   Respiratory:  no respiratory distress, normal breath sounds   Abdomen: soft, non-tender; no masses,  no organomegaly     Assessment:   Pregnancy: G1P0 Patient Active Problem List   Diagnosis Date Noted  . Hx of herpes simplex type 2 infection 08/14/2020  . Twin gestation in first trimester 08/14/2020  . Supervision of high risk pregnancy, antepartum 08/14/2020  . Encounter for supervision of high risk pregnancy in first trimester, antepartum 07/10/2020  . Obesity in pregnancy 11/27/2019  . Menorrhagia with irregular cycle 11/27/2019  . Polysubstance dependence ([place])   . Substance induced mood disorder ([place])   . MDD (major depressive disorder), recurrent severe, without psychosis ([place]) 11/09/2018  . Polysubstance abuse ([place])   . Altered mental status   . Acute encephalopathy 11/09/2017  . Mild renal insufficiency 11/09/2017  . Anemia 11/09/2017  . Thrombocytosis 11/09/2017  . Nexplanon in place 09/14/2016  . Herpes 08/13/2016  . Other constipation 08/13/2016  . Bipolar 1 disorder, mixed, moderate ([place]) 05/15/2014  . MDD (major depressive disorder), recurrent episode, severe ([place]) 11/21/2013  . ADHD (attention deficit hyperactivity disorder), combined type 11/21/2013     Plan:  1. Supervision of high risk pregnancy, antepartum Vomiting has improved with medication - now only a few days a week - none today. Will plan to see [facility] provider in our office - no counseling since age 63, no medications for depression, No symptoms today. Lives at home with her parents.  FOB aware of pregnancy - not in relationship with him currently Wears bra around the clock - advised to leave bra off at night so nipples can air out.  Advised to change bra daily until the dry patches on the nipples resolve.  2. Twin gestation in first trimester, unspecified multiple gestation type appt for ultrasound on 08-21-20  3.  Substance use Has not used cocaine since learning of pregnancy but did not know the risk of using cocaine in pregnancy  - risk of placental abruption, hemorrhage, death of babies and/or death of the mother.  Client reports she will not be using cocaine and was unaware of these severe possible consequences of use. Is using marijuana and advised to stop.  4. MDD (major depressive disorder), recurrent severe, without psychosis ([place]) At age 64 was having problems and was on medication and in counseling.  Has since stopped the medication and is no longer in counseling.  Reports she has difficulty with anxiety sometimes.   Reviewed increased risk for her for depression in the pregnancy and in the postpartum period.  Agrees to meet with  inhouse [facility] resource - will schedule appointment.  5. Nausea and vomiting during pregnancy Much improved with medication - continuing to take  6. Obesity in pregnancy Advised weight gain of 11-20 pounds in pregnancy  7. Hx of herpes simplex type 2 infection Client has been on suppression medication but stopped in when learning she was pregnant. Advised to take Valtrex 500 mg PO BID throughout the pregnancy - has medication for now.   Initial labs drawn. Continue prenatal vitamins. Genetic Screening discussed, NIPS: ordered. Ultrasound discussed; fetal anatomic survey: not yet scheduled. Problem list reviewed and updated. The nature of [place] with multiple MDs and other Advanced Practice Providers was explained to patient; also emphasized that residents, students are part of our team. Routine obstetric precautions reviewed. Return in about 4 weeks (around 09/11/2020) for in person [facility] - twins.  Total face-to-face time with patient: 40 minutes.  Over 50% of encounter was spent on counseling and coordination of care.     Earlie Server, FNP Family Nurse Practitioner, [facility] for [employer], [place] [facility] 08/14/2020 2:09 PM

## 2020-08-14 NOTE — Patient Instructions (Addendum)
Take Valtrex 500 mg by mouth every morning and night continuously through the pregnancy for suppression. Notify the office if you have an outbreak. Take Tylenol 325 mg 2 tablets by mouth every 4 hours if needed for pain. Drink at least 8 8-oz glasses of water every day.   Safe Medications in Pregnancy   Acne: Benzoyl Peroxide Salicylic Acid  Backache/Headache: Tylenol: 2 regular strength every 4 hours OR              2 Extra strength every 6 hours  Colds/Coughs/Allergies: Benadryl (alcohol free) 25 mg every 6 hours as needed Breath right strips Claritin Cepacol throat lozenges Chloraseptic throat spray Cold-Eeze- up to three times per day Cough drops, alcohol free Flonase (by prescription only) Guaifenesin Mucinex Robitussin DM (plain only, alcohol free) Saline nasal spray/drops Sudafed (pseudoephedrine) & Actifed ** use only after [redacted] weeks gestation and if you do not have high blood pressure Tylenol Vicks Vaporub Zinc lozenges Zyrtec   Constipation: Colace Ducolax suppositories Fleet enema Glycerin suppositories Metamucil Milk of magnesia Miralax Senokot Smooth move tea  Diarrhea: Kaopectate Imodium A-D  *NO pepto Bismol  Hemorrhoids: Anusol Anusol HC Preparation H Tucks  Indigestion: Tums Maalox Mylanta Zantac  Pepcid  Insomnia: Benadryl (alcohol free) 25mg  every 6 hours as needed Tylenol PM Unisom, no Gelcaps  Leg Cramps: Tums MagGel  Nausea/Vomiting:  Bonine Dramamine Emetrol Ginger extract Sea bands Meclizine  Nausea medication to take during pregnancy:  Unisom (doxylamine succinate 25 mg tablets) Take one tablet daily at bedtime. If symptoms are not adequately controlled, the dose can be increased to a maximum recommended dose of two tablets daily (1/2 tablet in the morning, 1/2 tablet mid-afternoon and one at bedtime). Vitamin B6 100mg  tablets. Take one tablet twice a day (up to 200 mg per day).  Skin Rashes: Aveeno  products Benadryl cream or 25mg  every 6 hours as needed Calamine Lotion 1% cortisone cream  Yeast infection: Gyne-lotrimin 7 Monistat 7   **If taking multiple medications, please check labels to avoid duplicating the same active ingredients **take medication as directed on the label ** Do not exceed 4000 mg of tylenol in 24 hours **Do not take medications that contain aspirin or ibuprofen

## 2020-08-14 NOTE — Progress Notes (Signed)
NOB Twins  NOB Intake done 07/10/20 Baby Scripts already ordered. B/P cuff sent Nausea Rx was sent also.   Planned Pregnancy: No   Genetic Screening: Desires and wants to know gender.   CC: Pain after intercourse,HA's  Pt notes nausea is getting better with Rx.   *Pt admits to only THC use.  Provider asked to assist w/ Twin FHT's.

## 2020-08-15 ENCOUNTER — Inpatient Hospital Stay (HOSPITAL_COMMUNITY)
Admission: AD | Admit: 2020-08-15 | Discharge: 2020-08-15 | Disposition: A | Discharge status: Home / Self Care | Payer: Medicaid Other | Attending: Obstetrics and Gynecology | Admitting: Obstetrics and Gynecology

## 2020-08-15 ENCOUNTER — Other Ambulatory Visit: Payer: Self-pay | Admitting: Student

## 2020-08-15 ENCOUNTER — Encounter (HOSPITAL_COMMUNITY): Payer: Self-pay | Admitting: Obstetrics and Gynecology

## 2020-08-15 ENCOUNTER — Other Ambulatory Visit: Payer: Self-pay

## 2020-08-15 DIAGNOSIS — O99891 Other specified diseases and conditions complicating pregnancy: Secondary | ICD-10-CM

## 2020-08-15 DIAGNOSIS — Z3A11 11 weeks gestation of pregnancy: Secondary | ICD-10-CM | POA: Insufficient documentation

## 2020-08-15 DIAGNOSIS — Z87891 Personal history of nicotine dependence: Secondary | ICD-10-CM | POA: Insufficient documentation

## 2020-08-15 DIAGNOSIS — R519 Headache, unspecified: Secondary | ICD-10-CM | POA: Diagnosis not present

## 2020-08-15 DIAGNOSIS — R10814 Left lower quadrant abdominal tenderness: Secondary | ICD-10-CM

## 2020-08-15 DIAGNOSIS — R1032 Left lower quadrant pain: Secondary | ICD-10-CM | POA: Insufficient documentation

## 2020-08-15 DIAGNOSIS — O26891 Other specified pregnancy related conditions, first trimester: Secondary | ICD-10-CM | POA: Insufficient documentation

## 2020-08-15 DIAGNOSIS — O99351 Diseases of the nervous system complicating pregnancy, first trimester: Secondary | ICD-10-CM | POA: Diagnosis not present

## 2020-08-15 DIAGNOSIS — G4489 Other headache syndrome: Secondary | ICD-10-CM | POA: Diagnosis not present

## 2020-08-15 DIAGNOSIS — O0991 Supervision of high risk pregnancy, unspecified, first trimester: Secondary | ICD-10-CM

## 2020-08-15 DIAGNOSIS — J45909 Unspecified asthma, uncomplicated: Secondary | ICD-10-CM | POA: Insufficient documentation

## 2020-08-15 LAB — CBC/D/PLT+RPR+RH+ABO+RUB AB...
Antibody Screen: NEGATIVE
Basophils Absolute: 0 10*3/uL (ref 0.0–0.2)
Basos: 0 %
EOS (ABSOLUTE): 0.1 10*3/uL (ref 0.0–0.4)
Eos: 1 %
HCV Ab: <0.1 s/co ratio (ref 0.0–0.9)
HIV Screen 4th Generation wRfx: NONREACTIVE
Hematocrit: 35.4 % (ref 34.0–46.6)
Hemoglobin: 11.9 g/dL (ref 11.1–15.9)
Hepatitis B Surface Ag: NEGATIVE
Immature Grans (Abs): 0 10*3/uL (ref 0.0–0.1)
Immature Granulocytes: 0 %
Lymphocytes Absolute: 2.9 10*3/uL (ref 0.7–3.1)
Lymphs: 39 %
MCH: 25.4 pg — ABNORMAL LOW (ref 26.6–33.0)
MCHC: 33.6 g/dL (ref 31.5–35.7)
MCV: 76 fL — ABNORMAL LOW (ref 79–97)
Monocytes Absolute: 0.5 10*3/uL (ref 0.1–0.9)
Monocytes: 6 %
Neutrophils Absolute: 4 10*3/uL (ref 1.4–7.0)
Neutrophils: 54 %
Platelets: 324 10*3/uL (ref 150–450)
RBC: 4.69 x10E6/uL (ref 3.77–5.28)
RDW: 14.4 % (ref 11.7–15.4)
RPR Ser Ql: NONREACTIVE
Rh Factor: POSITIVE
Rubella Antibodies, IGG: 5.52 index (ref >0.99)
WBC: 7.4 10*3/uL (ref 3.4–10.8)

## 2020-08-15 LAB — CERVICOVAGINAL ANCILLARY ONLY
Bacterial Vaginitis (gardnerella): POSITIVE — AB
Candida Glabrata: NEGATIVE
Candida Vaginitis: NEGATIVE
Chlamydia: NEGATIVE
Comment: NEGATIVE
Comment: NEGATIVE
Comment: NEGATIVE
Comment: NEGATIVE
Comment: NEGATIVE
Comment: NORMAL
Neisseria Gonorrhea: NEGATIVE
Trichomonas: NEGATIVE

## 2020-08-15 LAB — URINALYSIS, ROUTINE W REFLEX MICROSCOPIC
Bilirubin Urine: NEGATIVE
Glucose, UA: NEGATIVE mg/dL
Hgb urine dipstick: NEGATIVE
Ketones, ur: 80 mg/dL — AB
Leukocytes,Ua: NEGATIVE
Nitrite: NEGATIVE
Protein, ur: 100 mg/dL — AB
Specific Gravity, Urine: 1.026 (ref 1.005–1.030)
pH: 6 (ref 5.0–8.0)

## 2020-08-15 LAB — HCV INTERPRETATION

## 2020-08-15 MED ORDER — BUTALBITAL-APAP-CAFFEINE 50-325-40 MG PO TABS
2.0000 | ORAL_TABLET | ORAL | Status: DC | PRN
  Administered 2020-08-15: 2 via ORAL
  Filled 2020-08-15: qty 2

## 2020-08-15 MED ORDER — METRONIDAZOLE 500 MG PO TABS: 500.0000 mg | ORAL_TABLET | Freq: Two times a day (BID) | ORAL | 0 refills | Status: DC

## 2020-08-15 NOTE — MAU Note (Signed)
Having sharp pain in LLQ started today at work.  Has been having severe HA for the past 3 days, has been light sensitive.  Took 1000mg  of Tylenol one today, but then threw it up. Was afraid to take anything.

## 2020-08-15 NOTE — MAU Provider Note (Signed)
Patient Lacey [name] is a 19 y.o.  G1P0  at 28w4dhere with complaints of headache and abdominal pain. The abdominal happened earlier today and is now gone. The HA started three days ago; she is not sure if it is a migraine or not.   Patient denies vaginal bleeding, abnormal discharge, dysuria, pain with intercourse.  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  History     CSN: [id number] Arrival date and time: 08/15/20 1435   None     Chief Complaint  Patient presents with  . Abdominal Pain  . Headache   Abdominal Pain Associated symptoms include headaches. Pertinent negatives include no fever or vomiting.  Headache  This is a new problem. The current episode started in the past 7 days. The problem has been gradually worsening. The pain is located in the left unilateral region. Associated symptoms include abdominal pain. Pertinent negatives include no dizziness, fever, vomiting or weakness.    OB History    Gravida  1   Para      Term      Preterm      AB      Living        SAB      IAB      Ectopic      Multiple      Live Births              Past Medical History:  Diagnosis Date  . ADHD (attention deficit hyperactivity disorder)   . Allergy    seasonal  . Asthma   . Asthma   . Depression   . Mood disorder ([place]   . Obesity   . ODD (oppositional defiant disorder)   . Vision abnormalities    needs glasses    Past Surgical History:  Procedure Laterality Date  . FRACTURE SURGERY    . HARDWARE REMOVAL Right 04/10/2014   Procedure: RIGHT ANKLE HARDWARE REMOVAL;  Surgeon: GMeredith Pel MD;  [facility] [facility]  Service: Orthopedics;  Laterality: Right;  . ORIF ANKLE FRACTURE Right 12/29/2013   Procedure: OPEN REDUCTION INTERNAL FIXATION (ORIF) RIGHT ANKLE FRACTURE AND SYNDESMOTIC FIXATION.;  Surgeon: GMeredith Pel MD;  [facility] [facility]  Service: Orthopedics;  Laterality: Right;  RIGHT ANKLE FRACTURE AND SYNDESMOTIC FIXATION.     Family History  Problem Relation Age of Onset  . Stroke Mother   . Hypertension Mother   . Aneurysm Mother   . COPD Father   . Cancer Sister     Social History   Tobacco Use  . Smoking status: Former Smoker    Years: 2.00    Types: Cigarettes  . Smokeless tobacco: Never Used  . Tobacco comment: mom smokes and says pt does too *pt states since pregnancy no longer smoking  Vaping Use  . Vaping Use: Never used  Substance Use Topics  . Alcohol use: Not Currently    Comment: 3-4 months ago  . Drug use: Not Currently    Frequency: 1.0 times per week    Types: "Crack" cocaine, Cocaine, Other-see comments, Amphetamines, Methamphetamines, Marijuana    Comment: Admits to THC, crack, heroin; no UDS available    Allergies:  Allergies  Allergen Reactions  . Fish Allergy Itching    Throat itching  . Shellfish Allergy Itching    Throat itching    Medications Prior to Admission  Medication Sig Dispense Refill Last Dose  . acetaminophen (TYLENOL) 500 MG tablet Take 500 mg by  mouth every 6 (six) hours as needed.   08/15/2020 at Unknown time  . Blood Pressure Monitoring (BLOOD PRESSURE KIT) DEVI 1 kit by Does not apply route once a week. 1 each 0 08/14/2020 at Unknown time  . Doxylamine-Pyridoxine (DICLEGIS) 10-10 MG TBEC Take 2 tablets by mouth at bedtime. If symptoms persist, add one tablet in the morning and one in the afternoon 100 tablet 5 08/14/2020 at Unknown time  . Prenatal Vit-Fe Fumarate-FA (PREPLUS) 27-1 MG TABS Take 1 tablet by mouth daily. 30 tablet 13 Past Week at Unknown time  . valACYclovir (VALTREX) 1000 MG tablet Take 1 tablet (1,000 mg total) by mouth daily. 90 tablet 2 Past Month at Unknown time  . ferrous fumarate (HEMOCYTE - 106 MG FE) 325 (106 Fe) MG TABS tablet Take 1 tablet (106 mg of iron total) by mouth daily. 30 tablet 3 More than a month at Unknown time    Review of Systems  Constitutional: Negative for fever.  Gastrointestinal: Positive for abdominal  pain. Negative for vomiting.  Neurological: Positive for headaches. Negative for dizziness and weakness.   Physical Exam   Blood pressure 138/73, pulse 90, temperature 98.8 F (37.1 C), temperature source Oral, resp. rate 18, height '5\' 1"'  (1.549 m), weight 96.2 kg, last menstrual period 05/11/2020, SpO2 99 %.  Physical Exam Constitutional:      Appearance: She is well-developed.  Abdominal:     General: Abdomen is flat.     Palpations: Abdomen is soft.     Tenderness: There is abdominal tenderness in the left lower quadrant.  Neurological:     Mental Status: She is alert.     MAU Course  Procedures  MDM -reviewed labs from yesterday, Hemoglobin was 11.9. No lab work other than UA done today.  -no GC CT cultures done; patient had swabs collected yesterday.  -UA shows ketones; patient drank pitcher of water while in MAU.  -will offer fioricet> patient now reports that she feels "great".  -[place] reassuring for viable twin pregnancy today Pt informed that the ultrasound is considered a limited OB ultrasound and is not intended to be a complete ultrasound exam.  Patient also informed that the ultrasound is not being completed with the intent of assessing for fetal or placental anomalies or any pelvic abnormalities.  Explained that the purpose of today's ultrasound is to assess for  viability.  Patient acknowledges the purpose of the exam and the limitations of the study.  Active movements observed in both fetuses.    Assessment and Plan   1. Other headache syndrome   2. Encounter for supervision of high risk pregnancy in first trimester, antepartum    2. Reviewed importance of eating, staying hydrated and that some occasional abdominal pain is normal in pregnancy, especially with twins.  RX for flagyl given as patient had BV diagnosed yesterday.   3. Patient verbalized understanding; all questions answered.   Mervyn Skeeters Zaray Gatchel 08/15/2020, 5:25 PM

## 2020-08-15 NOTE — ED Notes (Signed)
DR. Vanita Panda in triage to screen patient for MAU

## 2020-08-15 NOTE — Discharge Instructions (Signed)
-  Keep hydrated! Drink water, juice, gatorade.  -until 28 weeks you can take excedrin migraine medicine

## 2020-08-15 NOTE — ED Notes (Signed)
Transport called to take patient over to MAU

## 2020-08-15 NOTE — ED Provider Notes (Signed)
Patient seen and evaluated, in NAD, now w \\abd  pain, no n/v/d, bleeding.  CaSE D/W mau clinician and patient transferred.   Carmin Muskrat, MD 08/15/20 [phone]

## 2020-08-16 ENCOUNTER — Telehealth: Payer: Self-pay

## 2020-08-16 NOTE — Telephone Encounter (Signed)
Called to advise of results, left vm

## 2020-08-21 ENCOUNTER — Ambulatory Visit: Payer: Medicaid Other | Admitting: *Deleted

## 2020-08-21 ENCOUNTER — Other Ambulatory Visit: Payer: Self-pay | Admitting: Obstetrics

## 2020-08-21 ENCOUNTER — Encounter: Payer: Self-pay | Admitting: Nurse Practitioner

## 2020-08-21 ENCOUNTER — Other Ambulatory Visit: Payer: Self-pay | Admitting: Obstetrics and Gynecology

## 2020-08-21 ENCOUNTER — Ambulatory Visit: Payer: Medicaid Other | Attending: Obstetrics and Gynecology

## 2020-08-21 ENCOUNTER — Encounter: Payer: Self-pay | Admitting: *Deleted

## 2020-08-21 ENCOUNTER — Other Ambulatory Visit: Payer: Self-pay

## 2020-08-21 DIAGNOSIS — Z3A12 12 weeks gestation of pregnancy: Secondary | ICD-10-CM

## 2020-08-21 DIAGNOSIS — O30033 Twin pregnancy, monochorionic/diamniotic, third trimester: Secondary | ICD-10-CM

## 2020-08-21 DIAGNOSIS — O99342 Other mental disorders complicating pregnancy, second trimester: Secondary | ICD-10-CM

## 2020-08-21 DIAGNOSIS — O30009 Twin pregnancy, unspecified number of placenta and unspecified number of amniotic sacs, unspecified trimester: Secondary | ICD-10-CM | POA: Diagnosis not present

## 2020-08-21 DIAGNOSIS — O99321 Drug use complicating pregnancy, first trimester: Secondary | ICD-10-CM | POA: Diagnosis not present

## 2020-08-21 DIAGNOSIS — O0991 Supervision of high risk pregnancy, unspecified, first trimester: Secondary | ICD-10-CM | POA: Diagnosis not present

## 2020-08-21 DIAGNOSIS — O99512 Diseases of the respiratory system complicating pregnancy, second trimester: Secondary | ICD-10-CM

## 2020-08-21 DIAGNOSIS — Z720 Tobacco use: Secondary | ICD-10-CM

## 2020-08-21 DIAGNOSIS — J45909 Unspecified asthma, uncomplicated: Secondary | ICD-10-CM

## 2020-08-21 DIAGNOSIS — O99212 Obesity complicating pregnancy, second trimester: Secondary | ICD-10-CM | POA: Diagnosis not present

## 2020-08-21 DIAGNOSIS — O99332 Smoking (tobacco) complicating pregnancy, second trimester: Secondary | ICD-10-CM

## 2020-08-21 DIAGNOSIS — R8271 Bacteriuria: Secondary | ICD-10-CM | POA: Insufficient documentation

## 2020-08-21 DIAGNOSIS — F99 Mental disorder, not otherwise specified: Secondary | ICD-10-CM

## 2020-08-21 LAB — URINE CULTURE, OB REFLEX

## 2020-08-21 LAB — CULTURE, OB URINE

## 2020-08-21 MED ORDER — AMOXICILLIN 500 MG PO TABS: 500.0000 mg | ORAL_TABLET | Freq: Three times a day (TID) | ORAL | 0 refills | Status: AC

## 2020-08-21 NOTE — Addendum Note (Signed)
Addended by: Virginia Rochester on: 08/21/2020 09:06 PM   Modules accepted: Orders

## 2020-08-22 ENCOUNTER — Other Ambulatory Visit: Payer: Self-pay | Admitting: *Deleted

## 2020-08-22 ENCOUNTER — Other Ambulatory Visit: Payer: Self-pay | Admitting: Nurse Practitioner

## 2020-08-22 DIAGNOSIS — O30039 Twin pregnancy, monochorionic/diamniotic, unspecified trimester: Secondary | ICD-10-CM

## 2020-08-22 DIAGNOSIS — Z3491 Encounter for supervision of normal pregnancy, unspecified, first trimester: Secondary | ICD-10-CM

## 2020-08-22 MED ORDER — PREPLUS 27-1 MG PO TABS: 1.0000 | ORAL_TABLET | Freq: Every day | ORAL | 13 refills | Status: DC

## 2020-08-28 ENCOUNTER — Encounter: Payer: Self-pay | Admitting: Nurse Practitioner

## 2020-09-04 DIAGNOSIS — H52223 Regular astigmatism, bilateral: Secondary | ICD-10-CM | POA: Diagnosis not present

## 2020-09-04 DIAGNOSIS — H5213 Myopia, bilateral: Secondary | ICD-10-CM | POA: Diagnosis not present

## 2020-09-10 ENCOUNTER — Ambulatory Visit (INDEPENDENT_AMBULATORY_CARE_PROVIDER_SITE_OTHER): Payer: Medicaid Other | Admitting: Licensed Clinical Social Worker

## 2020-09-10 ENCOUNTER — Other Ambulatory Visit: Payer: Self-pay

## 2020-09-10 ENCOUNTER — Telehealth: Payer: Self-pay | Admitting: Licensed Clinical Social Worker

## 2020-09-10 DIAGNOSIS — Z5329 Procedure and treatment not carried out because of patient's decision for other reasons: Secondary | ICD-10-CM

## 2020-09-10 DIAGNOSIS — Z91199 Patient's noncompliance with other medical treatment and regimen due to unspecified reason: Secondary | ICD-10-CM

## 2020-09-10 NOTE — BH Specialist Note (Signed)
No show appt

## 2020-09-10 NOTE — Telephone Encounter (Signed)
Called pt regarding scheduled mychart visit. Left message for callback

## 2020-09-11 ENCOUNTER — Other Ambulatory Visit: Payer: Self-pay

## 2020-09-11 ENCOUNTER — Ambulatory Visit (INDEPENDENT_AMBULATORY_CARE_PROVIDER_SITE_OTHER): Payer: Medicaid Other | Admitting: Obstetrics and Gynecology

## 2020-09-11 VITALS — BP 131/85 | HR 90 | Wt 201.0 lb

## 2020-09-11 DIAGNOSIS — F1920 Other psychoactive substance dependence, uncomplicated: Secondary | ICD-10-CM

## 2020-09-11 DIAGNOSIS — O0990 Supervision of high risk pregnancy, unspecified, unspecified trimester: Secondary | ICD-10-CM | POA: Diagnosis not present

## 2020-09-11 DIAGNOSIS — R8271 Bacteriuria: Secondary | ICD-10-CM

## 2020-09-11 DIAGNOSIS — O30032 Twin pregnancy, monochorionic/diamniotic, second trimester: Secondary | ICD-10-CM

## 2020-09-11 DIAGNOSIS — F902 Attention-deficit hyperactivity disorder, combined type: Secondary | ICD-10-CM

## 2020-09-11 DIAGNOSIS — F3162 Bipolar disorder, current episode mixed, moderate: Secondary | ICD-10-CM

## 2020-09-11 DIAGNOSIS — Z8619 Personal history of other infectious and parasitic diseases: Secondary | ICD-10-CM

## 2020-09-11 DIAGNOSIS — F332 Major depressive disorder, recurrent severe without psychotic features: Secondary | ICD-10-CM

## 2020-09-11 DIAGNOSIS — Z3A15 15 weeks gestation of pregnancy: Secondary | ICD-10-CM | POA: Insufficient documentation

## 2020-09-11 DIAGNOSIS — O3680X2 Pregnancy with inconclusive fetal viability, fetus 2: Secondary | ICD-10-CM | POA: Diagnosis not present

## 2020-09-11 DIAGNOSIS — F1910 Other psychoactive substance abuse, uncomplicated: Secondary | ICD-10-CM

## 2020-09-11 NOTE — Patient Instructions (Signed)
National Guideline Alliance ([place]).Twin and Triplet Pregnancy. [place]: National Institute for Health and Care Excellence (UK); 2019.">  Multiple Pregnancy Multiple pregnancy means that a woman is carrying more than one baby at a time. She may be pregnant with twins, triplets, or more. The majority of multiple pregnancies are twins. Naturally conceiving triplets or more (higher-order multiples) is rare. Multiple pregnancies are riskier than single pregnancies. A woman with a multiple pregnancy is more likely to have certain problems during her pregnancy. How does a multiple pregnancy happen? A multiple pregnancy happens when:  The woman's body releases more than one egg at a time, and then each egg gets fertilized by a different sperm. ? This is the most common type of multiple pregnancy. ? Twins or other multiples produced this way are called fraternal. They are no more alike than non-multiple siblings are.  One sperm fertilizes one egg, which then divides into more than one embryo. ? Twins or other multiples produced this way are called identical. Identical multiples are always the same gender, and they look very much alike.   Who is most likely to have a multiple pregnancy? A multiple pregnancy is more likely to develop in women who:  Have had fertility treatment, especially if the treatment included fertility medicines.  Are older than 21 years of age.  Have already had four or more children.  Have a family history of multiple pregnancy. How is a multiple pregnancy diagnosed? A multiple pregnancy may be diagnosed based on:  Symptoms such as: ? Rapid weight gain in the first 3 months of pregnancy (first trimester). ? More severe nausea and breast tenderness than what is typical of a single pregnancy. ? A larger uterus than what is normal for the stage of the pregnancy.  Blood tests that detect a higher-than-normal level of human chorionic gonadotropin (hCG). This is a hormone that your  body produces in early pregnancy.  An ultrasound exam. This is used to confirm that you are carrying multiples. What risks come with multiple pregnancy? A multiple pregnancy puts you at a higher risk for certain problems during or after your pregnancy. These include:  Delivering your babies before your due date (preterm birth). A full-term pregnancy lasts for at least 37 weeks. ? Babies born before 37 weeks may have a higher risk for breathing problems, feeding difficulties, cerebral palsy, and learning disabilities.  Diabetes.  Preeclampsia. This is a serious condition that causes high blood pressure and headaches during pregnancy.  Too much blood loss after childbirth (postpartum hemorrhage).  Postpartum depression.  Low birth weight of the babies. How will having a multiple pregnancy affect my care? Your health care team will monitor you more closely. You may need more frequent prenatal visits. This will ensure that you are healthy and that your babies are growing normally. Follow these instructions at home: Eating and drinking  Increase your nutrition. ? Follow your health care provider's recommendations for weight gain. You may need to gain a little extra weight when you are pregnant with multiples. ? Eat healthy snacks often throughout the day. This will add calories and reduce nausea.  Drink enough fluid to keep your urine pale yellow.  Take prenatal vitamins. Ask your health care provider what vitamins are right for you. Activity Limit your activities by 20-24 weeks of pregnancy.  Rest often.  Avoid activities, exercise, and work that take a lot of effort.  Ask your health care provider when you should stop having sex. General instructions  Do not   use any products that contain nicotine or tobacco, such as cigarettes, e-cigarettes, and chewing tobacco. If you need help quitting, ask your health care provider.  Do not drink alcohol or use illegal drugs.  Take  over-the-counter and prescription medicines only as told by your health care provider.  Arrange for extra help around the house.  Keep all follow-up visits and all prenatal visits as told by your health care provider. This is important. Where to find more information  American College of Obstetricians and Gynecology: www.acog.org Contact a health care provider if:  You have dizziness.  You have nausea, vomiting, or diarrhea that does not go away.  You have depression or other emotions that are interfering with your normal activities.  You have a fever.  You have pain with urination.  You have a bad-smelling vaginal discharge.  You notice increased swelling in your face, hands, legs, or ankles. Get help right away if:  You have fluid leaking from your vagina.  You have bleeding from your vagina.  You have pelvic cramps, pelvic pressure, or nagging pain in your abdomen or lower back.  You are having regular contractions.  You have a severe headache, with or without changes in how you see.  You have chest pain or shortness of breath.  You notice that your babies move less often, or do not move at all. Summary  Having a multiple pregnancy means that a woman is carrying more than one baby at a time.  A multiple pregnancy puts you at a higher risk for delivering your babies before your due date, having diabetes, preeclampsia, too much blood loss after childbirth, or low birth weight of the babies.  Your health care provider will monitor you more closely during your pregnancy.  You may need to make some lifestyle changes during pregnancy. This includes eating more, limiting your activities after 20-24 weeks of pregnancy, and arranging for extra help around the house.  Follow up with your health care provider as instructed if you experience any complications. This information is not intended to replace advice given to you by your health care provider. Make sure you discuss  any questions you have with your health care provider. Document Revised: 04/10/2019 Document Reviewed: 04/10/2019 Elsevier Patient Education  2021 Elsevier Inc.  

## 2020-09-11 NOTE — Progress Notes (Signed)
   PRENATAL VISIT NOTE  Subjective:  Lacey [name] is a 20 y.o. G1P0 at [redacted]w[redacted]d being seen today for ongoing prenatal care.  She is currently monitored for the following issues for this high-risk pregnancy and has MDD (major depressive disorder), recurrent episode, severe ([place]); ADHD (attention deficit hyperactivity disorder), combined type; Bipolar 1 disorder, mixed, moderate ([place]); Other constipation; Acute encephalopathy; Mild renal insufficiency; Anemia; Thrombocytosis; Altered mental status; Polysubstance abuse ([place]); MDD (major depressive disorder), recurrent severe, without psychosis ([place]); Polysubstance dependence ([place]); Substance induced mood disorder ([place]); Obesity in pregnancy; Encounter for supervision of high risk pregnancy in first trimester, antepartum; Hx of herpes simplex type 2 infection; Twin gestation in first trimester; Supervision of high risk pregnancy, antepartum; Group B streptococcal bacteriuria; and [redacted] weeks gestation of pregnancy on their problem list.  Patient doing well with no acute concerns today. She reports no complaints.  Contractions: Not present. Vag. Bleeding: None.  Movement: Present. Denies leaking of fluid.   Pt has lost 7 pounds since last visit, but notes nausea/vomiting has dissipated.  She is eating well at this point.  Will continue to monitor.  The following portions of the patient's history were reviewed and updated as appropriate: allergies, current medications, past family history, past medical history, past social history, past surgical history and problem list. Problem list updated.  Objective:   Vitals:   09/11/20 1317  BP: 131/85  Pulse: 90  Weight: 201 lb (91.2 kg)    Fetal Status: Fetal Heart Rate (bpm): u/s x 2   Movement: Present     General:  Alert, oriented and cooperative. Patient is in no acute distress.  Skin: Skin is warm and dry. No rash noted.   Cardiovascular: Normal heart rate noted  Respiratory: Normal respiratory effort,  no problems with respiration noted  Abdomen: Soft, gravid, appropriate for gestational age.  Pain/Pressure: Absent     Pelvic: Cervical exam deferred        Extremities: Normal range of motion.     Mental Status:  Normal mood and affect. Normal behavior. Normal judgment and thought content.  Live IUP x 2 with bedside u/s Assessment and Plan:  Pregnancy: G1P0 at [redacted]w[redacted]d  1. Group B streptococcal bacteriuria Treat in labor  2. Polysubstance dependence ([place])   3. Supervision of high risk pregnancy, antepartum  - AFP, Serum, Open Spina Bifida  4. Hx of herpes simplex type 2 infection Prophylaxis at 36 weeks  5. ADHD (attention deficit hyperactivity disorder), combined type   6. Severe episode of recurrent major depressive disorder, without psychotic features ([place])   7. Bipolar 1 disorder, mixed, moderate (HCC)   8. Polysubstance abuse (H[facility])   9. [redacted] weeks gestation of pregnancy  10. Mono/di twins, pt has MFM scan in 1 week   Preterm labor symptoms and general obstetric precautions including but not limited to vaginal bleeding, contractions, leaking of fluid and fetal movement were reviewed in detail with the patient.  Please refer to After Visit Summary for other counseling recommendations.   Return in about 3 weeks (around 10/02/2020) for [facility], in person.   Lynnda Shields, MD

## 2020-09-11 NOTE — Progress Notes (Signed)
Pt has questions about weight, concerned that she has lost weight.

## 2020-09-13 LAB — AFP, SERUM, OPEN SPINA BIFIDA
AFP MoM: 1.37
AFP Value: 38.6 ng/mL
Gest. Age on Collection Date: 15.3 weeks
Maternal Age At EDD: 20.7 yr
OSBR Risk 1 IN: 10000
Test Results:: NEGATIVE
Weight: 201 [lb_av]

## 2020-09-17 ENCOUNTER — Ambulatory Visit: Payer: Medicaid Other | Admitting: *Deleted

## 2020-09-17 ENCOUNTER — Encounter: Payer: Self-pay | Admitting: *Deleted

## 2020-09-17 ENCOUNTER — Other Ambulatory Visit: Payer: Self-pay | Admitting: Obstetrics

## 2020-09-17 ENCOUNTER — Other Ambulatory Visit: Payer: Self-pay

## 2020-09-17 ENCOUNTER — Ambulatory Visit: Payer: Medicaid Other | Attending: Obstetrics and Gynecology

## 2020-09-17 ENCOUNTER — Other Ambulatory Visit: Payer: Self-pay | Admitting: *Deleted

## 2020-09-17 DIAGNOSIS — O30032 Twin pregnancy, monochorionic/diamniotic, second trimester: Secondary | ICD-10-CM

## 2020-09-17 DIAGNOSIS — O99332 Smoking (tobacco) complicating pregnancy, second trimester: Secondary | ICD-10-CM

## 2020-09-17 DIAGNOSIS — E669 Obesity, unspecified: Secondary | ICD-10-CM

## 2020-09-17 DIAGNOSIS — O30039 Twin pregnancy, monochorionic/diamniotic, unspecified trimester: Secondary | ICD-10-CM

## 2020-09-17 DIAGNOSIS — F1990 Other psychoactive substance use, unspecified, uncomplicated: Secondary | ICD-10-CM

## 2020-09-17 DIAGNOSIS — O99342 Other mental disorders complicating pregnancy, second trimester: Secondary | ICD-10-CM | POA: Diagnosis not present

## 2020-09-17 DIAGNOSIS — O0991 Supervision of high risk pregnancy, unspecified, first trimester: Secondary | ICD-10-CM | POA: Diagnosis not present

## 2020-09-17 DIAGNOSIS — F99 Mental disorder, not otherwise specified: Secondary | ICD-10-CM

## 2020-09-17 DIAGNOSIS — O99322 Drug use complicating pregnancy, second trimester: Secondary | ICD-10-CM

## 2020-09-17 DIAGNOSIS — O99212 Obesity complicating pregnancy, second trimester: Secondary | ICD-10-CM | POA: Diagnosis not present

## 2020-09-17 DIAGNOSIS — O99512 Diseases of the respiratory system complicating pregnancy, second trimester: Secondary | ICD-10-CM

## 2020-09-17 DIAGNOSIS — Z3A16 16 weeks gestation of pregnancy: Secondary | ICD-10-CM

## 2020-09-17 DIAGNOSIS — J45909 Unspecified asthma, uncomplicated: Secondary | ICD-10-CM

## 2020-10-02 ENCOUNTER — Encounter: Payer: Medicaid Other | Admitting: Obstetrics & Gynecology

## 2020-10-03 ENCOUNTER — Ambulatory Visit: Payer: Medicaid Other

## 2020-10-09 ENCOUNTER — Encounter: Payer: Medicaid Other | Admitting: Obstetrics and Gynecology

## 2020-10-14 ENCOUNTER — Encounter: Payer: Self-pay | Admitting: Obstetrics and Gynecology

## 2020-10-14 ENCOUNTER — Ambulatory Visit (INDEPENDENT_AMBULATORY_CARE_PROVIDER_SITE_OTHER): Payer: Medicaid Other | Admitting: Obstetrics and Gynecology

## 2020-10-14 ENCOUNTER — Other Ambulatory Visit: Payer: Self-pay

## 2020-10-14 VITALS — BP 143/69 | HR 96 | Wt 211.0 lb

## 2020-10-14 DIAGNOSIS — O30032 Twin pregnancy, monochorionic/diamniotic, second trimester: Secondary | ICD-10-CM

## 2020-10-14 DIAGNOSIS — Z3491 Encounter for supervision of normal pregnancy, unspecified, first trimester: Secondary | ICD-10-CM

## 2020-10-14 DIAGNOSIS — O99210 Obesity complicating pregnancy, unspecified trimester: Secondary | ICD-10-CM

## 2020-10-14 DIAGNOSIS — F1920 Other psychoactive substance dependence, uncomplicated: Secondary | ICD-10-CM

## 2020-10-14 DIAGNOSIS — R8271 Bacteriuria: Secondary | ICD-10-CM

## 2020-10-14 DIAGNOSIS — B009 Herpesviral infection, unspecified: Secondary | ICD-10-CM

## 2020-10-14 DIAGNOSIS — O0990 Supervision of high risk pregnancy, unspecified, unspecified trimester: Secondary | ICD-10-CM

## 2020-10-14 DIAGNOSIS — Z8619 Personal history of other infectious and parasitic diseases: Secondary | ICD-10-CM

## 2020-10-14 MED ORDER — ASPIRIN EC 81 MG PO TBEC: 81.0000 mg | DELAYED_RELEASE_TABLET | Freq: Every day | ORAL | 2 refills | Status: DC

## 2020-10-14 MED ORDER — VALACYCLOVIR HCL 1 G PO TABS: 1000.0000 mg | ORAL_TABLET | Freq: Every day | ORAL | 2 refills | Status: DC

## 2020-10-14 MED ORDER — PREPLUS 27-1 MG PO TABS: 1.0000 | ORAL_TABLET | Freq: Every day | ORAL | 13 refills | Status: DC

## 2020-10-14 NOTE — Progress Notes (Signed)
   PRENATAL VISIT NOTE  Subjective:  Lacey [name] is a 20 y.o. G1P0 at [redacted]w[redacted]d being seen today for ongoing prenatal care.  She is currently monitored for the following issues for this high-risk pregnancy and has MDD (major depressive disorder), recurrent episode, severe ([place]); ADHD (attention deficit hyperactivity disorder), combined type; Bipolar 1 disorder, mixed, moderate ([place]); Other constipation; Acute encephalopathy; Mild renal insufficiency; Anemia; Thrombocytosis; Altered mental status; Polysubstance abuse ([place]); MDD (major depressive disorder), recurrent severe, without psychosis ([place]); Polysubstance dependence ([place]); Substance induced mood disorder ([place]); Obesity in pregnancy; Encounter for supervision of high risk pregnancy in first trimester, antepartum; Hx of herpes simplex type 2 infection; Supervision of high risk pregnancy, antepartum; Group B streptococcal bacteriuria; and Monochorionic diamniotic twin gestation in second trimester on their problem list.  Patient reports no complaints.  Contractions: Not present. Vag. Bleeding: None.  Movement: Absent. Denies leaking of fluid.   The following portions of the patient's history were reviewed and updated as appropriate: allergies, current medications, past family history, past medical history, past social history, past surgical history and problem list.   Objective:   Vitals:   10/14/20 1416  BP: (!) 143/69  Pulse: 96  Weight: 211 lb (95.7 kg)    Fetal Status: Fetal Heart Rate (bpm): 150/155   Movement: Absent     General:  Alert, oriented and cooperative. Patient is in no acute distress.  Skin: Skin is warm and dry. No rash noted.   Cardiovascular: Normal heart rate noted  Respiratory: Normal respiratory effort, no problems with respiration noted  Abdomen: Soft, gravid, appropriate for gestational age.  Pain/Pressure: Present     Pelvic: Cervical exam deferred        Extremities: Normal range of motion.     Mental  Status: Normal mood and affect. Normal behavior. Normal judgment and thought content.   Assessment and Plan:  Pregnancy: G1P0 at [redacted]w[redacted]d 1. Monochorionic diamniotic twin gestation in second trimester Follow up anatomy ultrasound this week  2. Supervision of high risk pregnancy, antepartum Patient is doing well without complaints  3. Obesity in pregnancy Rx ASA provided  4. Group B streptococcal bacteriuria Prophylaxis in labor  5. Hx of herpes simplex type 2 infection Prophylaxis at 36 weeks  6. Polysubstance dependence ([place]) Patient denies any further use of cocaine  Preterm labor symptoms and general obstetric precautions including but not limited to vaginal bleeding, contractions, leaking of fluid and fetal movement were reviewed in detail with the patient. Please refer to After Visit Summary for other counseling recommendations.   Return in about 4 weeks (around 11/11/2020) for in person, ROB, High risk.  Future Appointments  Date Time Provider [place]  10/16/2020  1:15 PM [facility] NURSE [facility]  10/16/2020  1:15 PM [facility] US2 [facility]  10/29/2020 11:15 AM [facility] NURSE [facility]  10/29/2020 11:30 AM [facility] US3 [facility]  11/12/2020 11:15 AM [facility] NURSE [facility]  11/12/2020 11:30 AM [facility] US3 [facility] [place]    Mora Bellman, MD

## 2020-10-14 NOTE — Progress Notes (Signed)
Pt states she is not yet feeling movement.  Pt has lump on left breast x 1 week.

## 2020-10-16 ENCOUNTER — Other Ambulatory Visit: Payer: Self-pay | Admitting: Obstetrics and Gynecology

## 2020-10-16 ENCOUNTER — Other Ambulatory Visit: Payer: Self-pay

## 2020-10-16 ENCOUNTER — Ambulatory Visit: Payer: Medicaid Other | Attending: Obstetrics and Gynecology | Admitting: *Deleted

## 2020-10-16 ENCOUNTER — Ambulatory Visit: Payer: Medicaid Other

## 2020-10-16 ENCOUNTER — Encounter: Payer: Self-pay | Admitting: *Deleted

## 2020-10-16 ENCOUNTER — Other Ambulatory Visit: Payer: Medicaid Other

## 2020-10-16 ENCOUNTER — Ambulatory Visit (HOSPITAL_BASED_OUTPATIENT_CLINIC_OR_DEPARTMENT_OTHER): Payer: Medicaid Other

## 2020-10-16 DIAGNOSIS — E669 Obesity, unspecified: Secondary | ICD-10-CM

## 2020-10-16 DIAGNOSIS — O99322 Drug use complicating pregnancy, second trimester: Secondary | ICD-10-CM | POA: Diagnosis not present

## 2020-10-16 DIAGNOSIS — O30032 Twin pregnancy, monochorionic/diamniotic, second trimester: Secondary | ICD-10-CM | POA: Diagnosis present

## 2020-10-16 DIAGNOSIS — O99212 Obesity complicating pregnancy, second trimester: Secondary | ICD-10-CM | POA: Insufficient documentation

## 2020-10-16 DIAGNOSIS — Z3A20 20 weeks gestation of pregnancy: Secondary | ICD-10-CM

## 2020-10-16 DIAGNOSIS — O365911 Maternal care for other known or suspected poor fetal growth, first trimester, fetus 1: Secondary | ICD-10-CM

## 2020-10-16 DIAGNOSIS — O365922 Maternal care for other known or suspected poor fetal growth, second trimester, fetus 2: Secondary | ICD-10-CM | POA: Insufficient documentation

## 2020-10-16 DIAGNOSIS — D569 Thalassemia, unspecified: Secondary | ICD-10-CM | POA: Diagnosis not present

## 2020-10-16 DIAGNOSIS — F1910 Other psychoactive substance abuse, uncomplicated: Secondary | ICD-10-CM | POA: Insufficient documentation

## 2020-10-16 DIAGNOSIS — Z3A19 19 weeks gestation of pregnancy: Secondary | ICD-10-CM | POA: Diagnosis not present

## 2020-10-16 DIAGNOSIS — Z148 Genetic carrier of other disease: Secondary | ICD-10-CM | POA: Diagnosis not present

## 2020-10-16 DIAGNOSIS — O30039 Twin pregnancy, monochorionic/diamniotic, unspecified trimester: Secondary | ICD-10-CM

## 2020-10-16 DIAGNOSIS — O0991 Supervision of high risk pregnancy, unspecified, first trimester: Secondary | ICD-10-CM

## 2020-10-16 DIAGNOSIS — O352XX0 Maternal care for (suspected) hereditary disease in fetus, not applicable or unspecified: Secondary | ICD-10-CM

## 2020-10-16 DIAGNOSIS — Z363 Encounter for antenatal screening for malformations: Secondary | ICD-10-CM | POA: Insufficient documentation

## 2020-10-16 DIAGNOSIS — O99012 Anemia complicating pregnancy, second trimester: Secondary | ICD-10-CM

## 2020-10-17 ENCOUNTER — Other Ambulatory Visit: Payer: Self-pay | Admitting: *Deleted

## 2020-10-17 DIAGNOSIS — R638 Other symptoms and signs concerning food and fluid intake: Secondary | ICD-10-CM

## 2020-10-29 ENCOUNTER — Ambulatory Visit: Payer: Medicaid Other

## 2020-11-07 ENCOUNTER — Ambulatory Visit: Payer: Medicaid Other | Attending: Obstetrics and Gynecology

## 2020-11-07 ENCOUNTER — Other Ambulatory Visit: Payer: Self-pay

## 2020-11-07 ENCOUNTER — Encounter: Payer: Self-pay | Admitting: *Deleted

## 2020-11-07 ENCOUNTER — Ambulatory Visit: Payer: Medicaid Other | Admitting: *Deleted

## 2020-11-07 DIAGNOSIS — O365921 Maternal care for other known or suspected poor fetal growth, second trimester, fetus 1: Secondary | ICD-10-CM

## 2020-11-07 DIAGNOSIS — D649 Anemia, unspecified: Secondary | ICD-10-CM

## 2020-11-07 DIAGNOSIS — F1910 Other psychoactive substance abuse, uncomplicated: Secondary | ICD-10-CM

## 2020-11-07 DIAGNOSIS — Z3A23 23 weeks gestation of pregnancy: Secondary | ICD-10-CM

## 2020-11-07 DIAGNOSIS — O321XX1 Maternal care for breech presentation, fetus 1: Secondary | ICD-10-CM

## 2020-11-07 DIAGNOSIS — O322XX2 Maternal care for transverse and oblique lie, fetus 2: Secondary | ICD-10-CM

## 2020-11-07 DIAGNOSIS — R638 Other symptoms and signs concerning food and fluid intake: Secondary | ICD-10-CM | POA: Insufficient documentation

## 2020-11-07 DIAGNOSIS — E669 Obesity, unspecified: Secondary | ICD-10-CM | POA: Diagnosis not present

## 2020-11-07 DIAGNOSIS — O99322 Drug use complicating pregnancy, second trimester: Secondary | ICD-10-CM

## 2020-11-07 DIAGNOSIS — O30039 Twin pregnancy, monochorionic/diamniotic, unspecified trimester: Secondary | ICD-10-CM | POA: Diagnosis not present

## 2020-11-07 DIAGNOSIS — O99212 Obesity complicating pregnancy, second trimester: Secondary | ICD-10-CM

## 2020-11-07 DIAGNOSIS — O0991 Supervision of high risk pregnancy, unspecified, first trimester: Secondary | ICD-10-CM | POA: Insufficient documentation

## 2020-11-07 DIAGNOSIS — O30032 Twin pregnancy, monochorionic/diamniotic, second trimester: Secondary | ICD-10-CM | POA: Diagnosis not present

## 2020-11-07 DIAGNOSIS — O99012 Anemia complicating pregnancy, second trimester: Secondary | ICD-10-CM

## 2020-11-07 DIAGNOSIS — Z148 Genetic carrier of other disease: Secondary | ICD-10-CM

## 2020-11-08 ENCOUNTER — Other Ambulatory Visit: Payer: Self-pay | Admitting: *Deleted

## 2020-11-08 DIAGNOSIS — O365921 Maternal care for other known or suspected poor fetal growth, second trimester, fetus 1: Secondary | ICD-10-CM

## 2020-11-11 ENCOUNTER — Ambulatory Visit (INDEPENDENT_AMBULATORY_CARE_PROVIDER_SITE_OTHER): Payer: Medicaid Other | Admitting: Obstetrics and Gynecology

## 2020-11-11 ENCOUNTER — Encounter: Payer: Self-pay | Admitting: Obstetrics and Gynecology

## 2020-11-11 ENCOUNTER — Other Ambulatory Visit: Payer: Self-pay

## 2020-11-11 VITALS — BP 126/80 | HR 94 | Wt 206.3 lb

## 2020-11-11 DIAGNOSIS — R8271 Bacteriuria: Secondary | ICD-10-CM

## 2020-11-11 DIAGNOSIS — Z8619 Personal history of other infectious and parasitic diseases: Secondary | ICD-10-CM

## 2020-11-11 DIAGNOSIS — O0991 Supervision of high risk pregnancy, unspecified, first trimester: Secondary | ICD-10-CM

## 2020-11-11 DIAGNOSIS — O30032 Twin pregnancy, monochorionic/diamniotic, second trimester: Secondary | ICD-10-CM

## 2020-11-11 DIAGNOSIS — O99210 Obesity complicating pregnancy, unspecified trimester: Secondary | ICD-10-CM

## 2020-11-11 NOTE — Progress Notes (Signed)
Patient presents for ROB. Patient has no concerns today. 

## 2020-11-11 NOTE — Progress Notes (Signed)
   PRENATAL VISIT NOTE  Subjective:  Lacey [name] is a 20 y.o. G1P0 at [redacted]w[redacted]d being seen today for ongoing prenatal care.  She is currently monitored for the following issues for this high-risk pregnancy and has MDD (major depressive disorder), recurrent episode, severe ([place]); ADHD (attention deficit hyperactivity disorder), combined type; Bipolar 1 disorder, mixed, moderate ([place]); Other constipation; Acute encephalopathy; Mild renal insufficiency; Anemia; Thrombocytosis; Altered mental status; Polysubstance abuse ([place]); MDD (major depressive disorder), recurrent severe, without psychosis ([place]); Polysubstance dependence ([place]); Substance induced mood disorder ([place]); Obesity in pregnancy; Encounter for supervision of high risk pregnancy in first trimester, antepartum; Hx of herpes simplex type 2 infection; Supervision of high risk pregnancy, antepartum; Group B streptococcal bacteriuria; and Monochorionic diamniotic twin gestation in second trimester on their problem list.  Patient reports no complaints.  Contractions: Not present. Vag. Bleeding: None.  Movement: Present. Denies leaking of fluid.   The following portions of the patient's history were reviewed and updated as appropriate: allergies, current medications, past family history, past medical history, past social history, past surgical history and problem list.   Objective:   Vitals:   11/11/20 1323  BP: 126/80  Pulse: 94  Weight: 206 lb 4.8 oz (93.6 kg)    Fetal Status: Fetal Heart Rate (bpm): 150,160   Movement: Present     General:  Alert, oriented and cooperative. Patient is in no acute distress.  Skin: Skin is warm and dry. No rash noted.   Cardiovascular: Normal heart rate noted  Respiratory: Normal respiratory effort, no problems with respiration noted  Abdomen: Soft, gravid, appropriate for gestational age.  Pain/Pressure: Absent     Pelvic: Cervical exam deferred        Extremities: Normal range of motion.  Edema: None   Mental Status: Normal mood and affect. Normal behavior. Normal judgment and thought content.   Assessment and Plan:  Pregnancy: G1P0 at [redacted]w[redacted]d 1. Encounter for supervision of high risk pregnancy in first trimester, antepartum Patient is doing well without complaints Third trimester labs next visit with glucola  2. Monochorionic diamniotic twin gestation in second trimester Follow up ultrasound 3/16  3. Group B streptococcal bacteriuria Prophylaxis in labor  4. Hx of herpes simplex type 2 infection Prophylaxis at 36 weeks  5. Obesity in pregnancy Continue ASA  Preterm labor symptoms and general obstetric precautions including but not limited to vaginal bleeding, contractions, leaking of fluid and fetal movement were reviewed in detail with the patient. Please refer to After Visit Summary for other counseling recommendations.   Return in about 4 weeks (around 12/09/2020) for in person, ROB, High risk, 2 hr glucola next visit.  Future Appointments  Date Time Provider [place]  11/13/2020 10:00 AM [facility] NURSE [facility]  11/13/2020 10:15 AM [facility] US2 [facility]  11/22/2020  1:30 PM [facility] NURSE [facility]  11/22/2020  1:45 PM [facility] US4 [facility]  11/22/2020  3:15 PM [facility] NST [facility]  11/28/2020 12:30 PM [facility] NURSE [facility]  11/28/2020 12:45 PM [facility] US4 [facility]  11/28/2020  2:15 PM [facility] NST [facility]  12/05/2020 12:30 PM [facility] NURSE [facility]  12/05/2020 12:45 PM [facility] US4 [facility]  12/05/2020  2:15 PM [facility] NST [facility]    Peggy Constant, MD

## 2020-11-12 ENCOUNTER — Ambulatory Visit: Payer: Medicaid Other

## 2020-11-13 ENCOUNTER — Ambulatory Visit: Payer: Medicaid Other

## 2020-11-22 ENCOUNTER — Ambulatory Visit: Payer: Medicaid Other | Attending: Obstetrics and Gynecology

## 2020-11-22 ENCOUNTER — Encounter: Payer: Self-pay | Admitting: *Deleted

## 2020-11-22 ENCOUNTER — Ambulatory Visit: Payer: Medicaid Other | Admitting: *Deleted

## 2020-11-22 ENCOUNTER — Other Ambulatory Visit: Payer: Self-pay

## 2020-11-22 DIAGNOSIS — O321XX2 Maternal care for breech presentation, fetus 2: Secondary | ICD-10-CM | POA: Diagnosis not present

## 2020-11-22 DIAGNOSIS — O30039 Twin pregnancy, monochorionic/diamniotic, unspecified trimester: Secondary | ICD-10-CM | POA: Diagnosis not present

## 2020-11-22 DIAGNOSIS — O0991 Supervision of high risk pregnancy, unspecified, first trimester: Secondary | ICD-10-CM

## 2020-11-22 DIAGNOSIS — O365921 Maternal care for other known or suspected poor fetal growth, second trimester, fetus 1: Secondary | ICD-10-CM | POA: Diagnosis not present

## 2020-11-22 DIAGNOSIS — Z3A26 26 weeks gestation of pregnancy: Secondary | ICD-10-CM

## 2020-11-22 DIAGNOSIS — O30012 Twin pregnancy, monochorionic/monoamniotic, second trimester: Secondary | ICD-10-CM | POA: Diagnosis not present

## 2020-11-22 NOTE — Procedures (Signed)
Lacey [name] 08-Apr-2001 [redacted]w[redacted]d  Fetus A Non-Stress Test Interpretation for 11/22/20  Indication: Mo-di Twins Lacey [name] 08-09-2001 [redacted]w[redacted]d   Fetus B Non-Stress Test Interpretation for 11/22/20  Indication: Mo-di Twins  Fetal Heart Rate Fetus B Mode: External Baseline Rate (B): 155 BPM (tracing intermittently) Variability: Moderate Accelerations:  (only able to monitor 14 min-only one acceleration during that time) Decelerations: None  Uterine Activity Mode: Palpation,Toco Contraction Frequency (min): none Resting Tone Palpated: Relaxed Resting Time: Adequate  Interpretation (Baby B - Fetal Testing) Nonstress Test Interpretation (Baby B):  (inconclusive due to inability to keep fetus on monitor) Overall Impression (Baby B): Reassuring for gestational age Comments (Baby B): Dr. Donalee Citrin reviewed tracing.    Fetal Heart Rate A Mode: External Baseline Rate (A): 150 bpm Variability: Moderate Accelerations: 10 x 10 Decelerations: None Multiple birth?: Yes  Uterine Activity Mode: Palpation,Toco Contraction Frequency (min): none Resting Tone Palpated: Relaxed Resting Time: Adequate  Interpretation (Fetal Testing) Nonstress Test Interpretation: Reactive Overall Impression: Reassuring for gestational age Comments: Dr. Donalee Citrin reviewed tracing.

## 2020-11-25 ENCOUNTER — Other Ambulatory Visit: Payer: Self-pay | Admitting: *Deleted

## 2020-11-25 DIAGNOSIS — O30032 Twin pregnancy, monochorionic/diamniotic, second trimester: Secondary | ICD-10-CM

## 2020-11-28 ENCOUNTER — Other Ambulatory Visit: Payer: Self-pay

## 2020-11-28 ENCOUNTER — Ambulatory Visit: Payer: Medicaid Other

## 2020-12-02 ENCOUNTER — Other Ambulatory Visit: Payer: Medicaid Other

## 2020-12-02 ENCOUNTER — Encounter: Payer: Medicaid Other | Admitting: Obstetrics and Gynecology

## 2020-12-04 ENCOUNTER — Other Ambulatory Visit: Payer: Medicaid Other

## 2020-12-04 ENCOUNTER — Encounter: Payer: Medicaid Other | Admitting: Obstetrics and Gynecology

## 2020-12-05 ENCOUNTER — Ambulatory Visit: Payer: Medicaid Other | Admitting: *Deleted

## 2020-12-05 ENCOUNTER — Ambulatory Visit (HOSPITAL_BASED_OUTPATIENT_CLINIC_OR_DEPARTMENT_OTHER): Payer: Medicaid Other | Admitting: *Deleted

## 2020-12-05 ENCOUNTER — Encounter: Payer: Self-pay | Admitting: *Deleted

## 2020-12-05 ENCOUNTER — Other Ambulatory Visit: Payer: Self-pay

## 2020-12-05 ENCOUNTER — Ambulatory Visit: Payer: Medicaid Other | Attending: Obstetrics and Gynecology

## 2020-12-05 DIAGNOSIS — O321XX1 Maternal care for breech presentation, fetus 1: Secondary | ICD-10-CM

## 2020-12-05 DIAGNOSIS — O365931 Maternal care for other known or suspected poor fetal growth, third trimester, fetus 1: Secondary | ICD-10-CM

## 2020-12-05 DIAGNOSIS — D649 Anemia, unspecified: Secondary | ICD-10-CM

## 2020-12-05 DIAGNOSIS — O99212 Obesity complicating pregnancy, second trimester: Secondary | ICD-10-CM

## 2020-12-05 DIAGNOSIS — O0991 Supervision of high risk pregnancy, unspecified, first trimester: Secondary | ICD-10-CM

## 2020-12-05 DIAGNOSIS — O30032 Twin pregnancy, monochorionic/diamniotic, second trimester: Secondary | ICD-10-CM | POA: Diagnosis not present

## 2020-12-05 DIAGNOSIS — Z148 Genetic carrier of other disease: Secondary | ICD-10-CM

## 2020-12-05 DIAGNOSIS — O30039 Twin pregnancy, monochorionic/diamniotic, unspecified trimester: Secondary | ICD-10-CM

## 2020-12-05 DIAGNOSIS — O322XX2 Maternal care for transverse and oblique lie, fetus 2: Secondary | ICD-10-CM

## 2020-12-05 DIAGNOSIS — O365921 Maternal care for other known or suspected poor fetal growth, second trimester, fetus 1: Secondary | ICD-10-CM | POA: Diagnosis not present

## 2020-12-05 DIAGNOSIS — Z3A27 27 weeks gestation of pregnancy: Secondary | ICD-10-CM | POA: Diagnosis not present

## 2020-12-05 DIAGNOSIS — O99012 Anemia complicating pregnancy, second trimester: Secondary | ICD-10-CM

## 2020-12-05 DIAGNOSIS — O99322 Drug use complicating pregnancy, second trimester: Secondary | ICD-10-CM

## 2020-12-05 DIAGNOSIS — F1910 Other psychoactive substance abuse, uncomplicated: Secondary | ICD-10-CM

## 2020-12-05 NOTE — Procedures (Signed)
AMBRIELLE KINGTON May 16, 2001 [redacted]w[redacted]d  Fetus A Non-Stress Test Interpretation for 12/05/20  Indication: IUGR EUGENE ZEIDERS 06-Apr-2001 [redacted]w[redacted]d   Fetus B Non-Stress Test Interpretation for 12/05/20  Indication: IUGR  Fetal Heart Rate Fetus B Mode: External Baseline Rate (B): 145 BPM Variability: Moderate Accelerations: 10 x 10 Decelerations: None  Uterine Activity Mode: Palpation,Toco Contraction Frequency (min): 1 uc Contraction Duration (sec): 60 Contraction Quality: Mild Resting Tone Palpated: Relaxed Resting Time: Adequate  Interpretation (Baby B - Fetal Testing) Nonstress Test Interpretation (Baby B): Reactive Overall Impression (Baby B): Reassuring for gestational age Comments (Baby B): Dr. Annamaria Boots reviewed tracing.    Fetal Heart Rate A Mode: External Baseline Rate (A): 150 bpm Variability: Moderate Accelerations: 10 x 10 Decelerations: None Multiple birth?: Yes  Uterine Activity Mode: Palpation,Toco Contraction Frequency (min): 1 uc Contraction Duration (sec): 60 Contraction Quality: Mild Resting Tone Palpated: Relaxed Resting Time: Adequate  Interpretation (Fetal Testing) Nonstress Test Interpretation: Reactive Overall Impression: Reassuring for gestational age Comments: Dr. Annamaria Boots reviewed tracing

## 2020-12-06 ENCOUNTER — Other Ambulatory Visit: Payer: Self-pay | Admitting: *Deleted

## 2020-12-06 DIAGNOSIS — O30039 Twin pregnancy, monochorionic/diamniotic, unspecified trimester: Secondary | ICD-10-CM

## 2020-12-12 ENCOUNTER — Other Ambulatory Visit: Payer: Self-pay

## 2020-12-12 ENCOUNTER — Ambulatory Visit: Payer: Medicaid Other | Attending: Obstetrics and Gynecology

## 2020-12-12 ENCOUNTER — Ambulatory Visit (HOSPITAL_BASED_OUTPATIENT_CLINIC_OR_DEPARTMENT_OTHER): Payer: Medicaid Other | Admitting: *Deleted

## 2020-12-12 ENCOUNTER — Ambulatory Visit: Payer: Medicaid Other | Admitting: *Deleted

## 2020-12-12 DIAGNOSIS — O30032 Twin pregnancy, monochorionic/diamniotic, second trimester: Secondary | ICD-10-CM | POA: Insufficient documentation

## 2020-12-12 DIAGNOSIS — O99013 Anemia complicating pregnancy, third trimester: Secondary | ICD-10-CM

## 2020-12-12 DIAGNOSIS — O365931 Maternal care for other known or suspected poor fetal growth, third trimester, fetus 1: Secondary | ICD-10-CM

## 2020-12-12 DIAGNOSIS — O30033 Twin pregnancy, monochorionic/diamniotic, third trimester: Secondary | ICD-10-CM | POA: Diagnosis not present

## 2020-12-12 DIAGNOSIS — O0991 Supervision of high risk pregnancy, unspecified, first trimester: Secondary | ICD-10-CM | POA: Diagnosis present

## 2020-12-12 DIAGNOSIS — Z3A28 28 weeks gestation of pregnancy: Secondary | ICD-10-CM

## 2020-12-12 DIAGNOSIS — O99213 Obesity complicating pregnancy, third trimester: Secondary | ICD-10-CM | POA: Diagnosis not present

## 2020-12-12 DIAGNOSIS — O322XX2 Maternal care for transverse and oblique lie, fetus 2: Secondary | ICD-10-CM

## 2020-12-12 DIAGNOSIS — O365932 Maternal care for other known or suspected poor fetal growth, third trimester, fetus 2: Secondary | ICD-10-CM | POA: Diagnosis not present

## 2020-12-12 DIAGNOSIS — F1910 Other psychoactive substance abuse, uncomplicated: Secondary | ICD-10-CM

## 2020-12-12 DIAGNOSIS — Z148 Genetic carrier of other disease: Secondary | ICD-10-CM

## 2020-12-12 DIAGNOSIS — O99323 Drug use complicating pregnancy, third trimester: Secondary | ICD-10-CM

## 2020-12-12 DIAGNOSIS — D649 Anemia, unspecified: Secondary | ICD-10-CM

## 2020-12-12 DIAGNOSIS — O321XX1 Maternal care for breech presentation, fetus 1: Secondary | ICD-10-CM

## 2020-12-12 NOTE — Progress Notes (Unsigned)
MFM Consultation  Follow up growth for monochorionic diamnoitic twin pregnancy with FGR in Twin A and B. Twin A normal stomach, amniotic fluid, bladder and Reactive NST- Maternal left, Cephalic  EFW 1.1 %  Twin B normal stomach, amniotic fluid, bladder and Reactive NST- Maternal right, Cephalic EFW 6% Discordance is 11%  UA Dopplers are normal in Twin B but elevated in Twin A there is no evidence of AEDF or REDF.  I discussed today's visit with Ms. Pfeffer. In particularly that the [place] persist in both twins. There is no evidence of TTTS/TAPS in either twin. The UA dopplers are elevalted today in Twin A but were elevated in Twin B previously.  I reiterated the management plan that includes weekly testing and serial growth. In addition given the EFW < 3%, elevated UA Dopplers and the shared placenta delivery should be considered between 35-36 weeks.  She expressed good fetal movement.  IF the UA Dopplers or testing worsens we recommend administration of betamethasone.  I spent 15 minutes with > 50% in face to face consultation.  She is scheduled for repeat testing in 1 week with UA Dopplers.  Vikki Ports, MD.

## 2020-12-12 NOTE — Procedures (Signed)
Lacey [name] May 23, 2001 [redacted]w[redacted]d  Fetus A Non-Stress Test Interpretation for 12/12/20  Indication: IUGR Lacey [name] 04/28/2001 [redacted]w[redacted]d   Fetus B Non-Stress Test Interpretation for 12/12/20  Indication: IUGR  Fetal Heart Rate Fetus B Mode: External Baseline Rate (B): 155 BPM Variability: Moderate Accelerations: 10 x 10 Decelerations: None  Uterine Activity Mode: Palpation,Toco Contraction Frequency (min): none Resting Tone Palpated: Relaxed Resting Time: Adequate  Interpretation (Baby B - Fetal Testing) Nonstress Test Interpretation (Baby B): Reactive Comments (Baby B): Dr. Gertie Exon reviewed tracing.    Fetal Heart Rate A Mode: External Baseline Rate (A): 150 bpm Variability: Moderate Accelerations: 10 x 10 Decelerations: None Multiple birth?: Yes  Uterine Activity Mode: Palpation,Toco Contraction Frequency (min): none Resting Tone Palpated: Relaxed Resting Time: Adequate  Interpretation (Fetal Testing) Nonstress Test Interpretation: Reactive Overall Impression: Reassuring for gestational age Comments: Dr. Gertie Exon reviewed tracing.

## 2020-12-20 ENCOUNTER — Ambulatory Visit: Payer: Medicaid Other

## 2020-12-20 ENCOUNTER — Other Ambulatory Visit: Payer: Self-pay

## 2020-12-23 ENCOUNTER — Other Ambulatory Visit: Payer: Self-pay

## 2020-12-23 ENCOUNTER — Ambulatory Visit: Payer: Medicaid Other | Admitting: *Deleted

## 2020-12-23 ENCOUNTER — Ambulatory Visit: Payer: Medicaid Other | Attending: Obstetrics and Gynecology

## 2020-12-23 ENCOUNTER — Ambulatory Visit (HOSPITAL_BASED_OUTPATIENT_CLINIC_OR_DEPARTMENT_OTHER): Payer: Medicaid Other | Admitting: *Deleted

## 2020-12-23 ENCOUNTER — Other Ambulatory Visit: Payer: Self-pay | Admitting: Obstetrics and Gynecology

## 2020-12-23 ENCOUNTER — Encounter: Payer: Self-pay | Admitting: *Deleted

## 2020-12-23 DIAGNOSIS — O0991 Supervision of high risk pregnancy, unspecified, first trimester: Secondary | ICD-10-CM

## 2020-12-23 DIAGNOSIS — Z148 Genetic carrier of other disease: Secondary | ICD-10-CM

## 2020-12-23 DIAGNOSIS — O365930 Maternal care for other known or suspected poor fetal growth, third trimester, not applicable or unspecified: Secondary | ICD-10-CM | POA: Diagnosis not present

## 2020-12-23 DIAGNOSIS — Z3A30 30 weeks gestation of pregnancy: Secondary | ICD-10-CM

## 2020-12-23 DIAGNOSIS — F1910 Other psychoactive substance abuse, uncomplicated: Secondary | ICD-10-CM

## 2020-12-23 DIAGNOSIS — O30039 Twin pregnancy, monochorionic/diamniotic, unspecified trimester: Secondary | ICD-10-CM | POA: Diagnosis present

## 2020-12-23 DIAGNOSIS — O99323 Drug use complicating pregnancy, third trimester: Secondary | ICD-10-CM

## 2020-12-23 DIAGNOSIS — O30033 Twin pregnancy, monochorionic/diamniotic, third trimester: Secondary | ICD-10-CM

## 2020-12-23 DIAGNOSIS — O365932 Maternal care for other known or suspected poor fetal growth, third trimester, fetus 2: Secondary | ICD-10-CM | POA: Diagnosis not present

## 2020-12-23 DIAGNOSIS — O99213 Obesity complicating pregnancy, third trimester: Secondary | ICD-10-CM

## 2020-12-23 DIAGNOSIS — O30032 Twin pregnancy, monochorionic/diamniotic, second trimester: Secondary | ICD-10-CM

## 2020-12-23 DIAGNOSIS — O365931 Maternal care for other known or suspected poor fetal growth, third trimester, fetus 1: Secondary | ICD-10-CM

## 2020-12-23 DIAGNOSIS — D649 Anemia, unspecified: Secondary | ICD-10-CM

## 2020-12-23 DIAGNOSIS — O99013 Anemia complicating pregnancy, third trimester: Secondary | ICD-10-CM

## 2020-12-23 DIAGNOSIS — O321XX1 Maternal care for breech presentation, fetus 1: Secondary | ICD-10-CM | POA: Diagnosis not present

## 2020-12-23 NOTE — Procedures (Signed)
Lacey [name] 2001-01-31 [redacted]w[redacted]d  Fetus A Non-Stress Test Interpretation for 12/23/20  Indication: IUGR Lacey [name] [birth year] [redacted]w[redacted]d   Fetus B Non-Stress Test Interpretation for 12/23/20  Indication: IUGR  Fetal Heart Rate Fetus B Mode: External Baseline Rate (B): 145 BPM Variability: Moderate Accelerations: 10 x 10 Decelerations: None  Uterine Activity Mode: Palpation,Toco Contraction Frequency (min): none Resting Tone Palpated: Relaxed Resting Time: Adequate  Interpretation (Baby B - Fetal Testing) Nonstress Test Interpretation (Baby B): Reactive Overall Impression (Baby B): Reassuring for gestational age Comments (Baby B): Dr. Annamaria Boots reviewed tracing.    Fetal Heart Rate A Mode: External Baseline Rate (A): 150 bpm Variability: Moderate Accelerations: 10 x 10 Decelerations: None Multiple birth?: Yes  Uterine Activity Mode: Palpation,Toco Contraction Frequency (min): none Resting Tone Palpated: Relaxed Resting Time: Adequate  Interpretation (Fetal Testing) Nonstress Test Interpretation: Reactive Overall Impression: Reassuring for gestational age Comments: Dr. Annamaria Boots reviewed tracing.

## 2021-01-02 ENCOUNTER — Encounter: Payer: Self-pay | Admitting: *Deleted

## 2021-01-02 ENCOUNTER — Other Ambulatory Visit: Payer: Self-pay

## 2021-01-02 ENCOUNTER — Ambulatory Visit (HOSPITAL_BASED_OUTPATIENT_CLINIC_OR_DEPARTMENT_OTHER): Payer: Medicaid Other | Admitting: *Deleted

## 2021-01-02 ENCOUNTER — Ambulatory Visit: Payer: Medicaid Other | Admitting: *Deleted

## 2021-01-02 ENCOUNTER — Ambulatory Visit: Payer: Medicaid Other | Attending: Obstetrics

## 2021-01-02 DIAGNOSIS — O30039 Twin pregnancy, monochorionic/diamniotic, unspecified trimester: Secondary | ICD-10-CM | POA: Diagnosis not present

## 2021-01-02 DIAGNOSIS — Z3A31 31 weeks gestation of pregnancy: Secondary | ICD-10-CM

## 2021-01-02 DIAGNOSIS — D649 Anemia, unspecified: Secondary | ICD-10-CM

## 2021-01-02 DIAGNOSIS — O30033 Twin pregnancy, monochorionic/diamniotic, third trimester: Secondary | ICD-10-CM | POA: Diagnosis not present

## 2021-01-02 DIAGNOSIS — O99213 Obesity complicating pregnancy, third trimester: Secondary | ICD-10-CM

## 2021-01-02 DIAGNOSIS — O0991 Supervision of high risk pregnancy, unspecified, first trimester: Secondary | ICD-10-CM | POA: Diagnosis present

## 2021-01-02 DIAGNOSIS — O365931 Maternal care for other known or suspected poor fetal growth, third trimester, fetus 1: Secondary | ICD-10-CM

## 2021-01-02 DIAGNOSIS — O322XX2 Maternal care for transverse and oblique lie, fetus 2: Secondary | ICD-10-CM

## 2021-01-02 DIAGNOSIS — O321XX1 Maternal care for breech presentation, fetus 1: Secondary | ICD-10-CM

## 2021-01-02 DIAGNOSIS — O365991 Maternal care for other known or suspected poor fetal growth, unspecified trimester, fetus 1: Secondary | ICD-10-CM

## 2021-01-02 DIAGNOSIS — O365932 Maternal care for other known or suspected poor fetal growth, third trimester, fetus 2: Secondary | ICD-10-CM | POA: Diagnosis not present

## 2021-01-02 DIAGNOSIS — F1910 Other psychoactive substance abuse, uncomplicated: Secondary | ICD-10-CM

## 2021-01-02 DIAGNOSIS — Z148 Genetic carrier of other disease: Secondary | ICD-10-CM

## 2021-01-02 DIAGNOSIS — O99323 Drug use complicating pregnancy, third trimester: Secondary | ICD-10-CM

## 2021-01-02 DIAGNOSIS — O99013 Anemia complicating pregnancy, third trimester: Secondary | ICD-10-CM

## 2021-01-02 NOTE — Procedures (Signed)
Lacey [name] [birth year] [redacted]w[redacted]d  Fetus A Non-Stress Test Interpretation for 01/02/21  Indication: IUGR Lacey [name] Jan 03, 2001 [redacted]w[redacted]d   Fetus B Non-Stress Test Interpretation for 01/02/21  Indication: IUGR  Fetal Heart Rate Fetus B Mode: External Baseline Rate (B): 145 BPM Variability: Moderate Accelerations: 10 x 10 Decelerations: None  Uterine Activity Mode: Palpation,Toco Contraction Frequency (min): none Resting Tone Palpated: Relaxed Resting Time: Adequate  Interpretation (Baby B - Fetal Testing) Nonstress Test Interpretation (Baby B): Reactive Overall Impression (Baby B): Reassuring for gestational age Comments (Baby B): Dr. Donalee Citrin reviewed tracing    Fetal Heart Rate A Mode: External Baseline Rate (A): 150 bpm Variability: Moderate Accelerations: 10 x 10 Decelerations: None Multiple birth?: Yes  Uterine Activity Mode: Palpation,Toco Contraction Frequency (min): none Resting Tone Palpated: Relaxed Resting Time: Adequate  Interpretation (Fetal Testing) Nonstress Test Interpretation: Reactive Overall Impression: Reassuring for gestational age Comments: Dr. Donalee Citrin reviewed tracing

## 2021-01-06 ENCOUNTER — Other Ambulatory Visit: Payer: Self-pay

## 2021-01-06 ENCOUNTER — Encounter: Payer: Self-pay | Admitting: *Deleted

## 2021-01-06 ENCOUNTER — Ambulatory Visit (INDEPENDENT_AMBULATORY_CARE_PROVIDER_SITE_OTHER): Payer: Medicaid Other | Admitting: Obstetrics and Gynecology

## 2021-01-06 ENCOUNTER — Encounter: Payer: Self-pay | Admitting: Obstetrics and Gynecology

## 2021-01-06 ENCOUNTER — Ambulatory Visit (INDEPENDENT_AMBULATORY_CARE_PROVIDER_SITE_OTHER): Payer: Medicaid Other | Admitting: Licensed Clinical Social Worker

## 2021-01-06 VITALS — BP 142/93 | HR 96 | Wt 222.0 lb

## 2021-01-06 DIAGNOSIS — Z599 Problem related to housing and economic circumstances, unspecified: Secondary | ICD-10-CM | POA: Diagnosis not present

## 2021-01-06 DIAGNOSIS — O30032 Twin pregnancy, monochorionic/diamniotic, second trimester: Secondary | ICD-10-CM

## 2021-01-06 DIAGNOSIS — F32A Depression, unspecified: Secondary | ICD-10-CM

## 2021-01-06 DIAGNOSIS — O99210 Obesity complicating pregnancy, unspecified trimester: Secondary | ICD-10-CM

## 2021-01-06 DIAGNOSIS — O0991 Supervision of high risk pregnancy, unspecified, first trimester: Secondary | ICD-10-CM | POA: Diagnosis not present

## 2021-01-06 DIAGNOSIS — O99340 Other mental disorders complicating pregnancy, unspecified trimester: Secondary | ICD-10-CM

## 2021-01-06 DIAGNOSIS — Z59819 Housing instability, housed unspecified: Secondary | ICD-10-CM

## 2021-01-06 DIAGNOSIS — R8271 Bacteriuria: Secondary | ICD-10-CM

## 2021-01-06 DIAGNOSIS — Z8619 Personal history of other infectious and parasitic diseases: Secondary | ICD-10-CM

## 2021-01-06 NOTE — Progress Notes (Signed)
ROB  TWINS 32wks. Last seen in office on 11/11/20 2hr GTT not done yet. GAD  19 PHQ-9 =14 * Referral placed to [place] notes baby asa everyday   CC: HA's.

## 2021-01-06 NOTE — Progress Notes (Addendum)
   PRENATAL VISIT NOTE  Subjective:  Lacey [name] is a 20 y.o. G1P0 at [redacted]w[redacted]d being seen today for ongoing prenatal care.  She is currently monitored for the following issues for this high-risk pregnancy and has MDD (major depressive disorder), recurrent episode, severe ([place]); ADHD (attention deficit hyperactivity disorder), combined type; Bipolar 1 disorder, mixed, moderate ([place]); Other constipation; Acute encephalopathy; Mild renal insufficiency; Anemia; Thrombocytosis; Altered mental status; Polysubstance abuse ([place]); MDD (major depressive disorder), recurrent severe, without psychosis ([place]); Polysubstance dependence ([place]); Substance induced mood disorder ([place]); Obesity in pregnancy; Encounter for supervision of high risk pregnancy in first trimester, antepartum; Hx of herpes simplex type 2 infection; Supervision of high risk pregnancy, antepartum; Group B streptococcal bacteriuria; and Monochorionic diamniotic twin gestation in second trimester on their problem list.  Patient reports no complaints.  Contractions: Irritability. Vag. Bleeding: None.  Movement: Present. Denies leaking of fluid.   The following portions of the patient's history were reviewed and updated as appropriate: allergies, current medications, past family history, past medical history, past social history, past surgical history and problem list.   Objective:   Vitals:   01/06/21 1505  BP: (!) 142/93  Pulse: 96  Weight: 222 lb (100.7 kg)    Fetal Status: Fetal Heart Rate (bpm): Rt:133Lt:148   Movement: Present     General:  Alert, oriented and cooperative. Patient is in no acute distress.  Skin: Skin is warm and dry. No rash noted.   Cardiovascular: Normal heart rate noted  Respiratory: Normal respiratory effort, no problems with respiration noted  Abdomen: Soft, gravid, appropriate for gestational age.  Pain/Pressure: Present     Pelvic: Cervical exam deferred        Extremities: Normal range of motion.  Edema:  None  Mental Status: Normal mood and affect. Normal behavior. Normal judgment and thought content.   Assessment and Plan:  Pregnancy: G1P0 at [redacted]w[redacted]d 1. Encounter for supervision of high risk pregnancy in first trimester, antepartum Patient is doing well without complaints Patient missed several appointments without clear reasons Patient agrees to 1 hour glucola today  2. Monochorionic diamniotic twin gestation in second trimester Breech/transverse presentation with IUGR Plan for delivery at 26 weeks per MFM via c-section- request made for 5/22  3. Group B streptococcal bacteriuria   4. Hx of herpes simplex type 2 infection   5. Obesity in pregnancy Continue ASA  Preterm labor symptoms and general obstetric precautions including but not limited to vaginal bleeding, contractions, leaking of fluid and fetal movement were reviewed in detail with the patient. Please refer to After Visit Summary for other counseling recommendations.   Return in about 1 week (around 01/13/2021) for in person, ROB, High risk.  Future Appointments  Date Time Provider [place]  01/09/2021  1:45 PM [facility] NURSE [facility]  01/09/2021  2:00 PM [facility] US1 [facility]    Mora Bellman, MD

## 2021-01-07 ENCOUNTER — Encounter (HOSPITAL_COMMUNITY): Payer: Self-pay

## 2021-01-07 ENCOUNTER — Telehealth: Payer: Self-pay | Admitting: *Deleted

## 2021-01-07 LAB — HEMOGLOBIN A1C
Est. average glucose Bld gHb Est-mCnc: 100 mg/dL
Hgb A1c MFr Bld: 5.1 % (ref 4.8–5.6)

## 2021-01-07 LAB — CBC
Hematocrit: 35.1 % (ref 34.0–46.6)
Hemoglobin: 11.5 g/dL (ref 11.1–15.9)
MCH: 25.5 pg — ABNORMAL LOW (ref 26.6–33.0)
MCHC: 32.8 g/dL (ref 31.5–35.7)
MCV: 78 fL — ABNORMAL LOW (ref 79–97)
Platelets: 260 10*3/uL (ref 150–450)
RBC: 4.51 x10E6/uL (ref 3.77–5.28)
RDW: 14.8 % (ref 11.7–15.4)
WBC: 7.5 10*3/uL (ref 3.4–10.8)

## 2021-01-07 LAB — RPR: RPR Ser Ql: NONREACTIVE

## 2021-01-07 LAB — GLUCOSE TOLERANCE, 1 HOUR: Glucose, 1Hr PP: 93 mg/dL (ref 65–199)

## 2021-01-07 LAB — HIV ANTIBODY (ROUTINE TESTING W REFLEX): HIV Screen 4th Generation wRfx: NONREACTIVE

## 2021-01-07 NOTE — Patient Instructions (Addendum)
STANA BAYON  01/07/2021   Your procedure is scheduled on:  01/19/2021  Arrive at [place] at Entrance C on [address] at [facility]  and [address]. You are invited to use the FREE valet parking or use the Visitor's parking deck.  Pick up the phone at the desk and dial [phone].  Call this number if you have problems the morning of surgery: [phone]  Remember:   Do not eat food:(After Midnight) Desps de medianoche.  Do not drink clear liquids: (After Midnight) Desps de medianoche.  Take these medicines the morning of surgery with A SIP OF WATER:  Take valtrex  Do not wear jewelry, make-up or nail polish.  Do not wear lotions, powders, or perfumes. Do not wear deodorant.  Do not shave 48 hours prior to surgery.  Do not bring valuables to the hospital.  [facility] is not   responsible for any belongings or valuables brought to the hospital.  Contacts, dentures or bridgework may not be worn into surgery.  Leave suitcase in the car. After surgery it may be brought to your room.  For patients admitted to the hospital, checkout time is 11:00 AM the day of              discharge.      Please read over the following fact sheets that you were given:     Preparing for Surgery

## 2021-01-07 NOTE — BH Specialist Note (Signed)
Integrated Behavioral Health Initial In-Person Visit  MRN: [id number] Name: Lacey [name]  Number of [facility] Clinician visits:: 1/6 Session Start time: 3:30pm  Session End time: 4:16p Total time: 46 minutes in person   Types of Service: [place]   Interpretor:no  Interpretor Name and Language: none    Warm Hand Off Completed.       Subjective: Lacey [name] is a 20 y.o. female accompanied by n/a Patient was referred by A.Burch RN  for elevated phq9 Patient reports the following symptoms/concerns: Unsecured housing, history of anxiety Duration of problem: approx 6 months ; Severity of problem: mild  Objective: Mood: goog and Affect: appropriate  Risk of harm to self or others: No risk of to self or others.   Life Context: Family and Social: Lives in [place] w/immediate family  School/Work: n/a Self-Care: n/a Life Changes: new pregnancy   Patient and/or Family's Strengths/Protective Factors: Pt and family need assistance with housing  Goals Addressed: Patient will: 1. Reduce symptoms of: depression and homelessness  2. Increase knowledge and/or ability of: coping skills and secured housing   3. Demonstrate ability to: self manage symptoms   Progress towards Goals: Ongoing   Interventions: Interventions utilized: supportive counseling   Standardized Assessments completed:  Flowsheet Row Routine Prenatal from 01/06/2021 in [facility]  PHQ-9 Total Score 14      Assessment: Patient currently experiencing depression affecting pregnancy    Patient may benefit from integrated behavioral health   Plan: 1. Follow up with behavioral health clinician on : 5/17 2. Behavioral recommendations  Contact housing and education resources given, keep scheduled medical appts, take prescribe medication as directed  3. Referral(s): affordable housing management and [facility] 4. "From  scale of 1-10, how likely are you to follow plan?":   Lynnea Ferrier, LCSW

## 2021-01-07 NOTE — Telephone Encounter (Signed)
Call to patient. Advised cesarean section scheduled for 01-19-21 at 1130. Arrive 0930. Patient reports has received notification through My Chart. Advised will receive call with Covid testing info and surgery instructions.   Encounter closed.

## 2021-01-09 ENCOUNTER — Other Ambulatory Visit: Payer: Self-pay

## 2021-01-09 ENCOUNTER — Encounter: Payer: Self-pay | Admitting: *Deleted

## 2021-01-09 ENCOUNTER — Ambulatory Visit: Payer: Medicaid Other | Admitting: *Deleted

## 2021-01-09 ENCOUNTER — Ambulatory Visit: Payer: Medicaid Other | Attending: Obstetrics

## 2021-01-09 DIAGNOSIS — F1910 Other psychoactive substance abuse, uncomplicated: Secondary | ICD-10-CM

## 2021-01-09 DIAGNOSIS — O0991 Supervision of high risk pregnancy, unspecified, first trimester: Secondary | ICD-10-CM | POA: Insufficient documentation

## 2021-01-09 DIAGNOSIS — E669 Obesity, unspecified: Secondary | ICD-10-CM

## 2021-01-09 DIAGNOSIS — O99323 Drug use complicating pregnancy, third trimester: Secondary | ICD-10-CM

## 2021-01-09 DIAGNOSIS — O30033 Twin pregnancy, monochorionic/diamniotic, third trimester: Secondary | ICD-10-CM | POA: Diagnosis not present

## 2021-01-09 DIAGNOSIS — O30039 Twin pregnancy, monochorionic/diamniotic, unspecified trimester: Secondary | ICD-10-CM | POA: Diagnosis not present

## 2021-01-09 DIAGNOSIS — O321XX1 Maternal care for breech presentation, fetus 1: Secondary | ICD-10-CM

## 2021-01-09 DIAGNOSIS — O99213 Obesity complicating pregnancy, third trimester: Secondary | ICD-10-CM | POA: Diagnosis not present

## 2021-01-09 DIAGNOSIS — O365931 Maternal care for other known or suspected poor fetal growth, third trimester, fetus 1: Secondary | ICD-10-CM | POA: Diagnosis not present

## 2021-01-09 DIAGNOSIS — Z148 Genetic carrier of other disease: Secondary | ICD-10-CM

## 2021-01-09 DIAGNOSIS — Z3A32 32 weeks gestation of pregnancy: Secondary | ICD-10-CM

## 2021-01-10 ENCOUNTER — Other Ambulatory Visit: Payer: Self-pay | Admitting: *Deleted

## 2021-01-13 ENCOUNTER — Ambulatory Visit: Payer: Medicaid Other | Admitting: *Deleted

## 2021-01-13 ENCOUNTER — Ambulatory Visit: Payer: Medicaid Other | Attending: Obstetrics and Gynecology | Admitting: *Deleted

## 2021-01-13 ENCOUNTER — Other Ambulatory Visit: Payer: Self-pay

## 2021-01-13 DIAGNOSIS — Z3A33 33 weeks gestation of pregnancy: Secondary | ICD-10-CM | POA: Diagnosis not present

## 2021-01-13 DIAGNOSIS — O365990 Maternal care for other known or suspected poor fetal growth, unspecified trimester, not applicable or unspecified: Secondary | ICD-10-CM

## 2021-01-13 DIAGNOSIS — O365932 Maternal care for other known or suspected poor fetal growth, third trimester, fetus 2: Secondary | ICD-10-CM | POA: Insufficient documentation

## 2021-01-13 DIAGNOSIS — O365931 Maternal care for other known or suspected poor fetal growth, third trimester, fetus 1: Secondary | ICD-10-CM

## 2021-01-13 DIAGNOSIS — O0991 Supervision of high risk pregnancy, unspecified, first trimester: Secondary | ICD-10-CM

## 2021-01-13 NOTE — Procedures (Signed)
Lacey [name] 03-13-2001 [redacted]w[redacted]d  Fetus A Non-Stress Test Interpretation for 01/13/21  Indication: IUGR Lacey [name] 2000-11-20 [redacted]w[redacted]d   Fetus B Non-Stress Test Interpretation for 01/13/21  Indication: IUGR  Fetal Heart Rate Fetus B Mode: External Baseline Rate (B): 140 BPM Variability: Moderate Accelerations: 15 x 15 Decelerations: None  Uterine Activity Mode: Palpation,Toco Contraction Frequency (min): UI Contraction Quality: Mild Resting Tone Palpated: Relaxed Resting Time: Adequate  Interpretation (Baby B - Fetal Testing) Nonstress Test Interpretation (Baby B): Reactive Comments (Baby B): Dr. Annamaria Boots reviewed tracing.    Fetal Heart Rate A Mode: External Baseline Rate (A): 145 bpm Variability: Moderate Accelerations: 15 x 15 Decelerations: None Multiple birth?: Yes  Uterine Activity Mode: Palpation,Toco Contraction Frequency (min): UI Contraction Quality: Mild Resting Tone Palpated: Relaxed Resting Time: Adequate  Interpretation (Fetal Testing) Nonstress Test Interpretation: Reactive Overall Impression: Reassuring for gestational age Comments: Dr. Annamaria Boots reviewed tracing.

## 2021-01-14 ENCOUNTER — Inpatient Hospital Stay (HOSPITAL_COMMUNITY)
Admission: AD | Admit: 2021-01-14 | Discharge: 2021-01-22 | DRG: 788 | Disposition: A | Discharge status: Home / Self Care | Payer: Medicaid Other | Attending: Obstetrics & Gynecology | Admitting: Obstetrics & Gynecology

## 2021-01-14 ENCOUNTER — Encounter (HOSPITAL_COMMUNITY): Payer: Self-pay | Admitting: Obstetrics & Gynecology

## 2021-01-14 ENCOUNTER — Other Ambulatory Visit: Payer: Self-pay

## 2021-01-14 ENCOUNTER — Ambulatory Visit (INDEPENDENT_AMBULATORY_CARE_PROVIDER_SITE_OTHER): Payer: Medicaid Other | Admitting: Obstetrics and Gynecology

## 2021-01-14 VITALS — BP 141/96 | HR 91 | Wt 220.0 lb

## 2021-01-14 DIAGNOSIS — Z7982 Long term (current) use of aspirin: Secondary | ICD-10-CM

## 2021-01-14 DIAGNOSIS — O1403 Mild to moderate pre-eclampsia, third trimester: Secondary | ICD-10-CM

## 2021-01-14 DIAGNOSIS — F1290 Cannabis use, unspecified, uncomplicated: Secondary | ICD-10-CM | POA: Diagnosis present

## 2021-01-14 DIAGNOSIS — Z87891 Personal history of nicotine dependence: Secondary | ICD-10-CM

## 2021-01-14 DIAGNOSIS — O328XX2 Maternal care for other malpresentation of fetus, fetus 2: Secondary | ICD-10-CM | POA: Diagnosis present

## 2021-01-14 DIAGNOSIS — O321XX1 Maternal care for breech presentation, fetus 1: Secondary | ICD-10-CM | POA: Diagnosis present

## 2021-01-14 DIAGNOSIS — F1910 Other psychoactive substance abuse, uncomplicated: Secondary | ICD-10-CM

## 2021-01-14 DIAGNOSIS — Z20822 Contact with and (suspected) exposure to covid-19: Secondary | ICD-10-CM | POA: Diagnosis present

## 2021-01-14 DIAGNOSIS — O99210 Obesity complicating pregnancy, unspecified trimester: Secondary | ICD-10-CM

## 2021-01-14 DIAGNOSIS — O1493 Unspecified pre-eclampsia, third trimester: Principal | ICD-10-CM

## 2021-01-14 DIAGNOSIS — O30032 Twin pregnancy, monochorionic/diamniotic, second trimester: Secondary | ICD-10-CM

## 2021-01-14 DIAGNOSIS — O99324 Drug use complicating childbirth: Secondary | ICD-10-CM | POA: Diagnosis present

## 2021-01-14 DIAGNOSIS — O1404 Mild to moderate pre-eclampsia, complicating childbirth: Principal | ICD-10-CM | POA: Diagnosis present

## 2021-01-14 DIAGNOSIS — F332 Major depressive disorder, recurrent severe without psychotic features: Secondary | ICD-10-CM | POA: Diagnosis present

## 2021-01-14 DIAGNOSIS — O99213 Obesity complicating pregnancy, third trimester: Secondary | ICD-10-CM

## 2021-01-14 DIAGNOSIS — Z3A33 33 weeks gestation of pregnancy: Secondary | ICD-10-CM

## 2021-01-14 DIAGNOSIS — O1400 Mild to moderate pre-eclampsia, unspecified trimester: Secondary | ICD-10-CM

## 2021-01-14 DIAGNOSIS — O0990 Supervision of high risk pregnancy, unspecified, unspecified trimester: Secondary | ICD-10-CM

## 2021-01-14 DIAGNOSIS — Z8619 Personal history of other infectious and parasitic diseases: Secondary | ICD-10-CM

## 2021-01-14 DIAGNOSIS — R8271 Bacteriuria: Secondary | ICD-10-CM

## 2021-01-14 DIAGNOSIS — O30033 Twin pregnancy, monochorionic/diamniotic, third trimester: Secondary | ICD-10-CM | POA: Diagnosis present

## 2021-01-14 DIAGNOSIS — O1490 Unspecified pre-eclampsia, unspecified trimester: Secondary | ICD-10-CM

## 2021-01-14 DIAGNOSIS — F3162 Bipolar disorder, current episode mixed, moderate: Secondary | ICD-10-CM

## 2021-01-14 DIAGNOSIS — O99214 Obesity complicating childbirth: Secondary | ICD-10-CM | POA: Diagnosis present

## 2021-01-14 DIAGNOSIS — O365930 Maternal care for other known or suspected poor fetal growth, third trimester, not applicable or unspecified: Secondary | ICD-10-CM | POA: Diagnosis present

## 2021-01-14 HISTORY — DX: Other psychoactive substance abuse, uncomplicated: F19.10

## 2021-01-14 HISTORY — DX: Other psychoactive substance use, unspecified with psychoactive substance-induced mood disorder: F19.94

## 2021-01-14 LAB — COMPREHENSIVE METABOLIC PANEL
ALT: 14 U/L (ref 0–44)
AST: 15 U/L (ref 15–41)
Albumin: 2.9 g/dL — ABNORMAL LOW (ref 3.5–5.0)
Alkaline Phosphatase: 118 U/L (ref 38–126)
Anion gap: 5 (ref 5–15)
BUN: <5 mg/dL — ABNORMAL LOW (ref 6–20)
CO2: 24 mmol/L (ref 22–32)
Calcium: 9.1 mg/dL (ref 8.9–10.3)
Chloride: 107 mmol/L (ref 98–111)
Creatinine, Ser: 0.65 mg/dL (ref 0.44–1.00)
GFR, Estimated: >60 mL/min (ref >60)
Glucose, Bld: 71 mg/dL (ref 70–99)
Potassium: 4.1 mmol/L (ref 3.5–5.1)
Sodium: 136 mmol/L (ref 135–145)
Total Bilirubin: 0.1 mg/dL — ABNORMAL LOW (ref 0.3–1.2)
Total Protein: 5.8 g/dL — ABNORMAL LOW (ref 6.5–8.1)

## 2021-01-14 LAB — CBC
HCT: 35 % — ABNORMAL LOW (ref 36.0–46.0)
Hemoglobin: 11.3 g/dL — ABNORMAL LOW (ref 12.0–15.0)
MCH: 25.9 pg — ABNORMAL LOW (ref 26.0–34.0)
MCHC: 32.3 g/dL (ref 30.0–36.0)
MCV: 80.3 fL (ref 80.0–100.0)
Platelets: 247 10*3/uL (ref 150–400)
RBC: 4.36 MIL/uL (ref 3.87–5.11)
RDW: 14.2 % (ref 11.5–15.5)
WBC: 6.8 10*3/uL (ref 4.0–10.5)
nRBC: 0 % (ref 0.0–0.2)

## 2021-01-14 LAB — RESP PANEL BY RT-PCR (FLU A&B, COVID) ARPGX2
Influenza A by PCR: NEGATIVE
Influenza B by PCR: NEGATIVE
SARS Coronavirus 2 by RT PCR: NEGATIVE

## 2021-01-14 LAB — PROTEIN / CREATININE RATIO, URINE
Creatinine, Urine: 610.83 mg/dL
Protein Creatinine Ratio: 1.21 mg/mg{Cre} — ABNORMAL HIGH (ref 0.00–0.15)
Total Protein, Urine: 742 mg/dL

## 2021-01-14 LAB — URINALYSIS, ROUTINE W REFLEX MICROSCOPIC
Glucose, UA: NEGATIVE mg/dL
Hgb urine dipstick: NEGATIVE
Ketones, ur: NEGATIVE mg/dL
Nitrite: NEGATIVE
Protein, ur: >=300 mg/dL — AB
Specific Gravity, Urine: 1.034 — ABNORMAL HIGH (ref 1.005–1.030)
pH: 5 (ref 5.0–8.0)

## 2021-01-14 LAB — TYPE AND SCREEN
ABO/RH(D): O POS
Antibody Screen: NEGATIVE

## 2021-01-14 MED ORDER — ZOLPIDEM TARTRATE 5 MG PO TABS
5.0000 mg | ORAL_TABLET | Freq: Every evening | ORAL | Status: DC | PRN
  Administered 2021-01-15 – 2021-01-16 (×2): 5 mg via ORAL
  Filled 2021-01-14 (×2): qty 1

## 2021-01-14 MED ORDER — BETAMETHASONE SOD PHOS & ACET 6 (3-3) MG/ML IJ SUSP
12.0000 mg | Freq: Once | INTRAMUSCULAR | Status: AC
  Administered 2021-01-14: 12 mg via INTRAMUSCULAR
  Filled 2021-01-14: qty 5

## 2021-01-14 MED ORDER — PRENATAL MULTIVITAMIN CH
1.0000 | ORAL_TABLET | Freq: Every day | ORAL | Status: DC
Start: 2021-01-15 — End: 2021-01-19
  Administered 2021-01-15 – 2021-01-18 (×3): 1 via ORAL
  Filled 2021-01-14 (×3): qty 1

## 2021-01-14 MED ORDER — ASPIRIN EC 81 MG PO TBEC
81.0000 mg | DELAYED_RELEASE_TABLET | Freq: Every day | ORAL | Status: DC
  Administered 2021-01-15 – 2021-01-18 (×4): 81 mg via ORAL
  Filled 2021-01-14 (×4): qty 1

## 2021-01-14 MED ORDER — ONDANSETRON 4 MG PO TBDP
8.0000 mg | ORAL_TABLET | Freq: Once | ORAL | Status: AC
  Administered 2021-01-14: 8 mg via ORAL
  Filled 2021-01-14: qty 2

## 2021-01-14 MED ORDER — VALACYCLOVIR HCL 500 MG PO TABS
1000.0000 mg | ORAL_TABLET | Freq: Every day | ORAL | Status: DC
  Administered 2021-01-14 – 2021-01-18 (×5): 1000 mg via ORAL
  Filled 2021-01-14 (×5): qty 2

## 2021-01-14 MED ORDER — METOCLOPRAMIDE HCL 10 MG PO TABS
10.0000 mg | ORAL_TABLET | Freq: Once | ORAL | Status: AC
  Administered 2021-01-14: 10 mg via ORAL
  Filled 2021-01-14: qty 1

## 2021-01-14 MED ORDER — DOCUSATE SODIUM 100 MG PO CAPS
100.0000 mg | ORAL_CAPSULE | Freq: Every day | ORAL | Status: DC
  Administered 2021-01-15 – 2021-01-18 (×4): 100 mg via ORAL
  Filled 2021-01-14 (×5): qty 1

## 2021-01-14 MED ORDER — ACETAMINOPHEN 500 MG PO TABS
1000.0000 mg | ORAL_TABLET | Freq: Once | ORAL | Status: AC
  Administered 2021-01-14: 1000 mg via ORAL
  Filled 2021-01-14: qty 2

## 2021-01-14 MED ORDER — ACETAMINOPHEN 325 MG PO TABS
650.0000 mg | ORAL_TABLET | ORAL | Status: DC | PRN
  Administered 2021-01-16 (×2): 650 mg via ORAL
  Filled 2021-01-14 (×2): qty 2

## 2021-01-14 MED ORDER — CALCIUM CARBONATE ANTACID 500 MG PO CHEW
2.0000 | CHEWABLE_TABLET | ORAL | Status: DC | PRN
  Administered 2021-01-15 – 2021-01-18 (×6): 400 mg via ORAL
  Filled 2021-01-14 (×6): qty 2

## 2021-01-14 NOTE — MAU Note (Signed)
Presents stating sent from MD office for BP evaluation.  Endorses H/A and visual disturbances, seeing spots.  Denies epigastric pain today, had last night.  Denies VB or LOF.  Endorses +FM.

## 2021-01-14 NOTE — MAU Provider Note (Signed)
History     CSN: [id number]  Arrival date and time: 01/14/21 1529   Event Date/Time   First Provider Initiated Contact with Patient 01/14/21 1632      Chief Complaint  Patient presents with  . BP Evaluation   HPI Lacey [name] is a 20 y.o. G1P0 at 33w2dwith mono/di twins who presents from the office for blood pressure evaluation. Denies every being told she's hypertensive outside of pregnancy. Has had some elevated BPs during prenatal care since [redacted] weeks gestation.  Reports frontal headache since this morning. Rates pain 7/10. Nothing makes better or worse. Hasn't treated headache. Had one episode of blurred vision en route to the hospital that has not continued. Denies epigastric pain. Denies abdominal pain, vaginal bleeding, or LOF. Reports good fetal movement x 2.   OB History    Gravida  1   Para      Term      Preterm      AB      Living        SAB      IAB      Ectopic      Multiple      Live Births              Past Medical History:  Diagnosis Date  . ADHD (attention deficit hyperactivity disorder)   . Allergy    seasonal  . Asthma   . Depression   . Mood disorder ([place]   . Obesity   . ODD (oppositional defiant disorder)   . Vision abnormalities    needs glasses    Past Surgical History:  Procedure Laterality Date  . FRACTURE SURGERY    . HARDWARE REMOVAL Right 04/10/2014   Procedure: RIGHT ANKLE HARDWARE REMOVAL;  Surgeon: GMeredith Pel MD;  [facility]  Service: Orthopedics;  Laterality: Right;  . ORIF ANKLE FRACTURE Right 12/29/2013   Procedure: OPEN REDUCTION INTERNAL FIXATION (ORIF) RIGHT ANKLE FRACTURE AND SYNDESMOTIC FIXATION.;  Surgeon: GMeredith Pel MD;  [facility]  Service: Orthopedics;  Laterality: Right;  RIGHT ANKLE FRACTURE AND SYNDESMOTIC FIXATION.    Family History  Problem Relation Age of Onset  . Stroke Mother   . Hypertension Mother   . Aneurysm Mother   . COPD Father   . Cancer Sister      Social History   Tobacco Use  . Smoking status: Former Smoker    Years: 2.00    Types: Cigarettes  . Smokeless tobacco: Never Used  . Tobacco comment: mom smokes and says pt does too *pt states since pregnancy no longer smoking  Vaping Use  . Vaping Use: Never used  Substance Use Topics  . Alcohol use: Not Currently    Comment: 3-4 months ago  . Drug use: Not Currently    Frequency: 1.0 times per week    Types: "Crack" cocaine, Cocaine, Other-see comments, Amphetamines, Methamphetamines, Marijuana    Comment: Admits to THC, crack, heroin; no UDS available    Allergies:  Allergies  Allergen Reactions  . Fish Allergy Itching    Throat itching  . Shellfish Allergy Itching    Throat itching    Medications Prior to Admission  Medication Sig Dispense Refill Last Dose  . aspirin EC 81 MG tablet Take 1 tablet (81 mg total) by mouth daily. Take after 12 weeks for prevention of preeclampsia later in pregnancy (Patient taking differently: Take 81 mg by mouth in the morning. Take after 12 weeks  for prevention of preeclampsia later in pregnancy) 300 tablet 2 01/13/2021 at Unknown time  . Blood Pressure Monitoring (BLOOD PRESSURE KIT) DEVI 1 kit by Does not apply route once a week. 1 each 0   . calcium carbonate (TUMS - DOSED IN MG ELEMENTAL CALCIUM) 500 MG chewable tablet Chew 2 tablets by mouth 3 (three) times daily as needed for heartburn.     . Doxylamine-Pyridoxine (DICLEGIS) 10-10 MG TBEC Take 2 tablets by mouth at bedtime. If symptoms persist, add one tablet in the morning and one in the afternoon (Patient not taking: No sig reported) 100 tablet 5   . ferrous fumarate (HEMOCYTE - 106 MG FE) 325 (106 Fe) MG TABS tablet Take 1 tablet (106 mg of iron total) by mouth daily. (Patient not taking: No sig reported) 30 tablet 3   . Prenatal Vit-Fe Fumarate-FA (PREPLUS) 27-1 MG TABS Take 1 tablet by mouth daily. (Patient taking differently: Take 1 tablet by mouth in the morning.) 30 tablet 13    . valACYclovir (VALTREX) 1000 MG tablet Take 1 tablet (1,000 mg total) by mouth daily. 90 tablet 2     Review of Systems  Constitutional: Negative.   Eyes: Positive for visual disturbance. Negative for photophobia.  Gastrointestinal: Negative.   Genitourinary: Negative.   Neurological: Positive for headaches.   Physical Exam   Blood pressure (!) 140/101, pulse 78, temperature 98 F (36.7 C), temperature source Oral, resp. rate 19, height 5' 1" (1.549 m), weight 100.2 kg, last menstrual period 05/11/2020, SpO2 100 %.  Patient Vitals for the past 24 hrs:  BP Temp Temp src Pulse Resp SpO2 Height Weight  01/14/21 1845 (!) 140/101 -- -- 78 -- -- -- --  01/14/21 1830 119/73 -- -- 87 -- 100 % -- --  01/14/21 1815 (!) 97/48 -- -- (!) 112 -- 100 % -- --  01/14/21 1800 (!) 149/84 -- -- 72 -- 99 % -- --  01/14/21 1745 (!) 156/102 -- -- 75 -- 99 % -- --  01/14/21 1730 (!) 149/84 -- -- 77 -- 100 % -- --  01/14/21 1715 (!) 143/81 -- -- 81 -- 99 % -- --  01/14/21 1700 (!) 145/93 -- -- 83 -- -- -- --  01/14/21 1645 (!) 152/105 -- -- 78 -- -- -- --  01/14/21 1630 (!) 150/100 -- -- 83 -- -- -- --  01/14/21 1618 (!) 154/103 -- -- 85 -- 99 % -- --  01/14/21 1548 139/87 98 F (36.7 C) Oral (!) 102 19 100 % -- --  01/14/21 1543 -- -- -- -- -- -- 5' 1" (1.549 m) 100.2 kg    Physical Exam Vitals and nursing note reviewed.  Constitutional:      General: She is not in acute distress.    Appearance: Normal appearance.  HENT:     Head: Normocephalic and atraumatic.  Cardiovascular:     Rate and Rhythm: Normal rate and regular rhythm.     Heart sounds: Normal heart sounds.  Pulmonary:     Effort: Pulmonary effort is normal. No respiratory distress.     Breath sounds: Normal breath sounds.  Abdominal:     Tenderness: There is no abdominal tenderness.  Musculoskeletal:     Right lower leg: No edema.     Left lower leg: No edema.  Skin:    General: Skin is warm and dry.  Neurological:      Mental Status: She is alert.     Deep Tendon Reflexes:  Reflex Scores:      Patellar reflexes are 2+ on the right side and 2+ on the left side.    Comments: No clonus  Psychiatric:        Mood and Affect: Mood normal.        Behavior: Behavior normal.    Fetal Tracing: Baby A Baseline: 145 Variability: moderate Accelerations: 15x15 Decelerations: none  Baby B Baseline: 140 Variability: moderate Accelerations: 15x15 Decelerations: none  Toco: none  MAU Course  Procedures Results for orders placed or performed during the hospital encounter of 01/14/21 (from the past 24 hour(s))  Urinalysis, Routine w reflex microscopic Urine, Clean Catch     Status: Abnormal   Collection Time: 01/14/21  4:08 PM  Result Value Ref Range   Color, Urine AMBER (A) YELLOW   APPearance HAZY (A) CLEAR   Specific Gravity, Urine 1.034 (H) 1.005 - 1.030   pH 5.0 5.0 - 8.0   Glucose, UA NEGATIVE NEGATIVE mg/dL   Hgb urine dipstick NEGATIVE NEGATIVE   Bilirubin Urine SMALL (A) NEGATIVE   Ketones, ur NEGATIVE NEGATIVE mg/dL   Protein, ur >=300 (A) NEGATIVE mg/dL   Nitrite NEGATIVE NEGATIVE   Leukocytes,Ua SMALL (A) NEGATIVE   RBC / HPF 6-10 0 - 5 RBC/hpf   WBC, UA 6-10 0 - 5 WBC/hpf   Bacteria, UA RARE (A) NONE SEEN   Squamous Epithelial / LPF 11-20 0 - 5   Mucus PRESENT    Non Squamous Epithelial 0-5 (A) NONE SEEN  Protein / creatinine ratio, urine     Status: Abnormal   Collection Time: 01/14/21  4:08 PM  Result Value Ref Range   Creatinine, Urine 610.83 mg/dL   Total Protein, Urine 742.0 mg/dL   Protein Creatinine Ratio 1.21 (H) 0.00 - 0.15 mg/mg[Cre]  CBC     Status: Abnormal   Collection Time: 01/14/21  4:19 PM  Result Value Ref Range   WBC 6.8 4.0 - 10.5 K/uL   RBC 4.36 3.87 - 5.11 MIL/uL   Hemoglobin 11.3 (L) 12.0 - 15.0 g/dL   HCT 35.0 (L) 36.0 - 46.0 %   MCV 80.3 80.0 - 100.0 fL   MCH 25.9 (L) 26.0 - 34.0 pg   MCHC 32.3 30.0 - 36.0 g/dL   RDW 14.2 11.5 - 15.5 %   Platelets  247 150 - 400 K/uL   nRBC 0.0 0.0 - 0.2 %  Comprehensive metabolic panel     Status: Abnormal   Collection Time: 01/14/21  4:19 PM  Result Value Ref Range   Sodium 136 135 - 145 mmol/L   Potassium 4.1 3.5 - 5.1 mmol/L   Chloride 107 98 - 111 mmol/L   CO2 24 22 - 32 mmol/L   Glucose, Bld 71 70 - 99 mg/dL   BUN <5 (L) 6 - 20 mg/dL   Creatinine, Ser 0.65 0.44 - 1.00 mg/dL   Calcium 9.1 8.9 - 10.3 mg/dL   Total Protein 5.8 (L) 6.5 - 8.1 g/dL   Albumin 2.9 (L) 3.5 - 5.0 g/dL   AST 15 15 - 41 U/L   ALT 14 0 - 44 U/L   Alkaline Phosphatase 118 38 - 126 U/L   Total Bilirubin 0.1 (L) 0.3 - 1.2 mg/dL   GFR, Estimated >60 >60 mL/min   Anion gap 5 5 - 15    MDM Patient presents for BP evaluation. Question preexisting CHTN vs GHTN. First elevated BP during the pregnancy was at 20 wks and again at 32 weeks.  She denies any history of hypertension but per review of epic has had a few visits with elevation - no diagnosis on chart.   Tylenol & reglan given for headache with resolution of pain.  No severe range BPs in MAU but borderline. Preeclampsia blood work is normal but elevated protein creatinine ratio indicates preeclampsia.   First dose of BMZ given  Reviewed with Dr. Elgie Congo - will obs on OBSC unit until second dose of BMZ can be given.   Assessment and Plan   1. Pre-eclampsia in third trimester   2. Monochorionic diamniotic twin gestation in second trimester   3. [redacted] weeks gestation of pregnancy    -obs on OBSC unit -monitor BPs -give second dose of [place] 01/14/2021, 6:57 PM

## 2021-01-14 NOTE — Progress Notes (Signed)
   PRENATAL VISIT NOTE  Subjective:  Lacey [name] is a 20 y.o. G1P0 at [redacted]w[redacted]d being seen today for ongoing prenatal care.  She is currently monitored for the following issues for this high-risk pregnancy and has MDD (major depressive disorder), recurrent episode, severe ([place]); ADHD (attention deficit hyperactivity disorder), combined type; Bipolar 1 disorder, mixed, moderate ([place]); Other constipation; Acute encephalopathy; Mild renal insufficiency; Anemia; Thrombocytosis; Altered mental status; Polysubstance abuse ([place]); MDD (major depressive disorder), recurrent severe, without psychosis ([place]); Polysubstance dependence ([place]); Substance induced mood disorder ([place]); Obesity in pregnancy; Encounter for supervision of high risk pregnancy in first trimester, antepartum; Hx of herpes simplex type 2 infection; Supervision of high risk pregnancy, antepartum; Group B streptococcal bacteriuria; Monochorionic diamniotic twin gestation in second trimester; and [redacted] weeks gestation of pregnancy on their problem list.  Patient doing well with no acute concerns today. She reports no complaints.  Contractions: Not present. Vag. Bleeding: None.  Movement: Present. Denies leaking of fluid.   Pt BP elevated.  She denies HA, visual changes and RUQ pain.  She is scheduled for delivery at 34 weeks by c section on 5/22.  The following portions of the patient's history were reviewed and updated as appropriate: allergies, current medications, past family history, past medical history, past social history, past surgical history and problem list. Problem list updated.  Objective:   Vitals:   01/14/21 1313  BP: (!) 141/96  Pulse: 91  Weight: 220 lb (99.8 kg)    Fetal Status: Fetal Heart Rate (bpm): Rt: 144 Lt: 151   Movement: Present     General:  Alert, oriented and cooperative. Patient is in no acute distress.  Skin: Skin is warm and dry. No rash noted.   Cardiovascular: Normal heart rate noted  Respiratory:  Normal respiratory effort, no problems with respiration noted  Abdomen: Soft, gravid, appropriate for gestational age.  Pain/Pressure: Absent     Pelvic: Cervical exam deferred        Extremities: Normal range of motion.  Edema: None  Mental Status:  Normal mood and affect. Normal behavior. Normal judgment and thought content.   Assessment and Plan:  Pregnancy: G1P0 at [redacted]w[redacted]d  1. [redacted] weeks gestation of pregnancy   2. Polysubstance abuse ([place])   3. Obesity in pregnancy   4. Monochorionic diamniotic twin gestation in second trimester Scheduled for delivery at 68 weeks  5. MDD (major depressive disorder), recurrent severe, without psychosis ([place])   6. Hx of herpes simplex type 2 infection   7. Group B streptococcal bacteriuria   8. Supervision of high risk pregnancy, antepartum Due to elevated BP today and earlier in the month, pt to MAU for labs and serial BP.  Pt also will need BMZ in preparation for her delivery at 34 weeks, pt is aware of plan and agrees  9. Bipolar 1 disorder, mixed, moderate ([facility])   Preterm labor symptoms and general obstetric precautions including but not limited to vaginal bleeding, contractions, leaking of fluid and fetal movement were reviewed in detail with the patient.  Please refer to After Visit Summary for other counseling recommendations.      Lynnda Shields, MD Faculty Attending Center for [facility]
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
## 2021-01-14 NOTE — Progress Notes (Signed)
+   Fetal movement. No complaints.

## 2021-01-15 ENCOUNTER — Observation Stay (HOSPITAL_BASED_OUTPATIENT_CLINIC_OR_DEPARTMENT_OTHER): Payer: Medicaid Other

## 2021-01-15 ENCOUNTER — Other Ambulatory Visit: Payer: Self-pay | Admitting: Women's Health

## 2021-01-15 DIAGNOSIS — Z87891 Personal history of nicotine dependence: Secondary | ICD-10-CM | POA: Diagnosis not present

## 2021-01-15 DIAGNOSIS — Z20822 Contact with and (suspected) exposure to covid-19: Secondary | ICD-10-CM | POA: Diagnosis present

## 2021-01-15 DIAGNOSIS — O329XX0 Maternal care for malpresentation of fetus, unspecified, not applicable or unspecified: Secondary | ICD-10-CM | POA: Diagnosis not present

## 2021-01-15 DIAGNOSIS — O1413 Severe pre-eclampsia, third trimester: Secondary | ICD-10-CM | POA: Diagnosis not present

## 2021-01-15 DIAGNOSIS — D649 Anemia, unspecified: Secondary | ICD-10-CM

## 2021-01-15 DIAGNOSIS — O321XX1 Maternal care for breech presentation, fetus 1: Secondary | ICD-10-CM | POA: Diagnosis not present

## 2021-01-15 DIAGNOSIS — O1494 Unspecified pre-eclampsia, complicating childbirth: Secondary | ICD-10-CM | POA: Diagnosis not present

## 2021-01-15 DIAGNOSIS — O99213 Obesity complicating pregnancy, third trimester: Secondary | ICD-10-CM | POA: Diagnosis not present

## 2021-01-15 DIAGNOSIS — O322XX2 Maternal care for transverse and oblique lie, fetus 2: Secondary | ICD-10-CM

## 2021-01-15 DIAGNOSIS — F1290 Cannabis use, unspecified, uncomplicated: Secondary | ICD-10-CM | POA: Diagnosis present

## 2021-01-15 DIAGNOSIS — O99324 Drug use complicating childbirth: Secondary | ICD-10-CM | POA: Diagnosis present

## 2021-01-15 DIAGNOSIS — Z3A33 33 weeks gestation of pregnancy: Secondary | ICD-10-CM

## 2021-01-15 DIAGNOSIS — Z3A34 34 weeks gestation of pregnancy: Secondary | ICD-10-CM | POA: Diagnosis not present

## 2021-01-15 DIAGNOSIS — E669 Obesity, unspecified: Secondary | ICD-10-CM | POA: Diagnosis not present

## 2021-01-15 DIAGNOSIS — O365931 Maternal care for other known or suspected poor fetal growth, third trimester, fetus 1: Secondary | ICD-10-CM | POA: Diagnosis not present

## 2021-01-15 DIAGNOSIS — O99013 Anemia complicating pregnancy, third trimester: Secondary | ICD-10-CM | POA: Diagnosis not present

## 2021-01-15 DIAGNOSIS — Z148 Genetic carrier of other disease: Secondary | ICD-10-CM

## 2021-01-15 DIAGNOSIS — O365930 Maternal care for other known or suspected poor fetal growth, third trimester, not applicable or unspecified: Secondary | ICD-10-CM | POA: Diagnosis present

## 2021-01-15 DIAGNOSIS — Z7982 Long term (current) use of aspirin: Secondary | ICD-10-CM | POA: Diagnosis not present

## 2021-01-15 DIAGNOSIS — O1404 Mild to moderate pre-eclampsia, complicating childbirth: Secondary | ICD-10-CM | POA: Diagnosis present

## 2021-01-15 DIAGNOSIS — O99214 Obesity complicating childbirth: Secondary | ICD-10-CM | POA: Diagnosis present

## 2021-01-15 DIAGNOSIS — O30033 Twin pregnancy, monochorionic/diamniotic, third trimester: Secondary | ICD-10-CM | POA: Diagnosis not present

## 2021-01-15 DIAGNOSIS — O1490 Unspecified pre-eclampsia, unspecified trimester: Secondary | ICD-10-CM

## 2021-01-15 DIAGNOSIS — O365932 Maternal care for other known or suspected poor fetal growth, third trimester, fetus 2: Secondary | ICD-10-CM | POA: Diagnosis not present

## 2021-01-15 DIAGNOSIS — F1910 Other psychoactive substance abuse, uncomplicated: Secondary | ICD-10-CM

## 2021-01-15 DIAGNOSIS — O43813 Placental infarction, third trimester: Secondary | ICD-10-CM | POA: Diagnosis not present

## 2021-01-15 DIAGNOSIS — O1493 Unspecified pre-eclampsia, third trimester: Secondary | ICD-10-CM | POA: Diagnosis not present

## 2021-01-15 DIAGNOSIS — O328XX2 Maternal care for other malpresentation of fetus, fetus 2: Secondary | ICD-10-CM | POA: Diagnosis not present

## 2021-01-15 DIAGNOSIS — O99323 Drug use complicating pregnancy, third trimester: Secondary | ICD-10-CM

## 2021-01-15 LAB — RAPID URINE DRUG SCREEN, HOSP PERFORMED
Amphetamines: NOT DETECTED
Barbiturates: NOT DETECTED
Benzodiazepines: NOT DETECTED
Cocaine: NOT DETECTED
Opiates: NOT DETECTED
Tetrahydrocannabinol: POSITIVE — AB

## 2021-01-15 MED ORDER — ONDANSETRON 4 MG PO TBDP
8.0000 mg | ORAL_TABLET | Freq: Three times a day (TID) | ORAL | Status: DC | PRN
  Administered 2021-01-15 – 2021-01-18 (×2): 8 mg via ORAL
  Filled 2021-01-15 (×3): qty 2

## 2021-01-15 MED ORDER — SODIUM CHLORIDE 0.9 % IV SOLN
12.5000 mg | Freq: Four times a day (QID) | INTRAVENOUS | Status: DC | PRN
  Filled 2021-01-15: qty 0.5

## 2021-01-15 NOTE — Plan of Care (Signed)
  Problem: Nutrition: Goal: Adequate nutrition will be maintained Outcome: Completed/Met   Problem: Coping: Goal: Level of anxiety will decrease Outcome: Completed/Met   Problem: Elimination: Goal: Will not experience complications related to urinary retention Outcome: Completed/Met   

## 2021-01-15 NOTE — Progress Notes (Signed)
FACULTY PRACTICE ANTEPARTUM(COMPREHENSIVE) NOTE  Lacey [name] is a 20 y.o. G1P0 with Estimated Date of Delivery: 03/02/21   By  early ultrasound [redacted]w[redacted]d  who is admitted for mo/di twins now with preeclampsia- no severe features.    Fetal presentation is breech/oblique. Length of Stay:  0  Days  Date of admission:01/14/2021  Subjective: Pt resting comfortably in bed- no headache, no blurry vision, no RUQ pain.  Denies nausea/vomiting.  No acute complaints Patient reports the fetal movement as not yet feeling movement this am. Patient reports uterine contraction  activity as none. Patient reports  vaginal bleeding as none. Patient describes fluid per vagina as None.  Vitals:  Blood pressure (!) 143/81, pulse 98, temperature 98.5 F (36.9 C), temperature source Oral, resp. rate 19, height 5\' 1"  (1.549 m), weight 100.2 kg, last menstrual period 05/11/2020, SpO2 100 %. Vitals:   01/14/21 1932 01/14/21 2316 01/15/21 0354 01/15/21 0817  BP: (!) 148/93 135/85 (!) 145/81 (!) 143/81  Pulse: 84 93 97 98  Resp: 20 18 (!) 96 19  Temp: 98.2 F (36.8 C) 99.1 F (37.3 C) 98.2 F (36.8 C) 98.5 F (36.9 C)  TempSrc: Oral Oral Oral Oral  SpO2: 100% 100% 99% 100%  Weight:      Height:       Physical Examination:  General appearance - alert, well appearing, and in no distress Mental status - normal mood, behavior, speech, dress, motor activity, and thought processes Chest - clear to auscultation, no wheezes Heart - normal rate and regular rhythm Abdomen - gravid, non-tender Extremities - no edema, no calf tenderness bilaterally Skin - warm and dry   Fetal Monitoring:  Twin A: Baseline: 140 bpm, Variability: moderate, Accelerations: +accels, and Decelerations: Absent    Twin B: 140 baseline, moderate variability, +accels, no decels Reactive x 2  Labs:  Results for orders placed or performed during the hospital encounter of 01/14/21 (from the past 24 hour(s))  Urinalysis, Routine w reflex  microscopic Urine, Clean Catch   Collection Time: 01/14/21  4:08 PM  Result Value Ref Range   Color, Urine AMBER (A) YELLOW   APPearance HAZY (A) CLEAR   Specific Gravity, Urine 1.034 (H) 1.005 - 1.030   pH 5.0 5.0 - 8.0   Glucose, UA NEGATIVE NEGATIVE mg/dL   Hgb urine dipstick NEGATIVE NEGATIVE   Bilirubin Urine SMALL (A) NEGATIVE   Ketones, ur NEGATIVE NEGATIVE mg/dL   Protein, ur >=300 (A) NEGATIVE mg/dL   Nitrite NEGATIVE NEGATIVE   Leukocytes,Ua SMALL (A) NEGATIVE   RBC / HPF 6-10 0 - 5 RBC/hpf   WBC, UA 6-10 0 - 5 WBC/hpf   Bacteria, UA RARE (A) NONE SEEN   Squamous Epithelial / LPF 11-20 0 - 5   Mucus PRESENT    Non Squamous Epithelial 0-5 (A) NONE SEEN  Protein / creatinine ratio, urine   Collection Time: 01/14/21  4:08 PM  Result Value Ref Range   Creatinine, Urine 610.83 mg/dL   Total Protein, Urine 742.0 mg/dL   Protein Creatinine Ratio 1.21 (H) 0.00 - 0.15 mg/mg[Cre]  CBC   Collection Time: 01/14/21  4:19 PM  Result Value Ref Range   WBC 6.8 4.0 - 10.5 K/uL   RBC 4.36 3.87 - 5.11 MIL/uL   Hemoglobin 11.3 (L) 12.0 - 15.0 g/dL   HCT 35.0 (L) 36.0 - 46.0 %   MCV 80.3 80.0 - 100.0 fL   MCH 25.9 (L) 26.0 - 34.0 pg   MCHC 32.3 30.0 -  36.0 g/dL   RDW 14.2 11.5 - 15.5 %   Platelets 247 150 - 400 K/uL   nRBC 0.0 0.0 - 0.2 %  Comprehensive metabolic panel   Collection Time: 01/14/21  4:19 PM  Result Value Ref Range   Sodium 136 135 - 145 mmol/L   Potassium 4.1 3.5 - 5.1 mmol/L   Chloride 107 98 - 111 mmol/L   CO2 24 22 - 32 mmol/L   Glucose, Bld 71 70 - 99 mg/dL   BUN <5 (L) 6 - 20 mg/dL   Creatinine, Ser 0.65 0.44 - 1.00 mg/dL   Calcium 9.1 8.9 - 10.3 mg/dL   Total Protein 5.8 (L) 6.5 - 8.1 g/dL   Albumin 2.9 (L) 3.5 - 5.0 g/dL   AST 15 15 - 41 U/L   ALT 14 0 - 44 U/L   Alkaline Phosphatase 118 38 - 126 U/L   Total Bilirubin 0.1 (L) 0.3 - 1.2 mg/dL   GFR, Estimated >60 >60 mL/min   Anion gap 5 5 - 15  Type and screen [place]    Collection Time: 01/14/21  4:19 PM  Result Value Ref Range   ABO/RH(D) O POS    Antibody Screen NEG    Sample Expiration      01/17/2021,2359 Performed at [facility], [address] [place], [place] [address]   Resp Panel by RT-PCR (Flu A&B, Covid) Nasopharyngeal Swab   Collection Time: 01/14/21  7:16 PM   Specimen: Nasopharyngeal Swab; Nasopharyngeal(NP) swabs in vial transport medium  Result Value Ref Range   SARS Coronavirus 2 by RT PCR NEGATIVE NEGATIVE   Influenza A by PCR NEGATIVE NEGATIVE   Influenza B by PCR NEGATIVE NEGATIVE    Imaging Studies:    No results found.   Medications:  Scheduled . aspirin EC  81 mg Oral Daily  . docusate sodium  100 mg Oral Daily  . prenatal multivitamin  1 tablet Oral Q1200  . valACYclovir  1,000 mg Oral Daily   I have reviewed the patient's current medications.  ASSESSMENT: G1P0 [redacted]w[redacted]d Estimated Date of Delivery: 03/02/21 admitted due to mono/di twins, IUGR x2, now diagnosed with preeclampia without severe features  PLAN: 1) Preeclampsia no severe features -labs as above, pt currently asymptomatic -continue ASA daily -repeat labs as clinically indicated and/or twice weekly 2) IUGR -FWB- NICHD- Cat. 1 x 2 -BPP and dopplers to be completed today -BMZ # 1 given on 5/17, 2nd dose today  3) Maternal well being -h/o polysubstance abuse- denies recent use during pregnancy- plan to review with patient and urine drug screen to be completed -h/o depression, no meds currently mood appropriate  DISPO: Plan for in-patient monitoring until delivery @ 34wks.  Will continue with close observation as outlined above.  Clearnce Sorrel Camala Talwar 01/15/2021,10:23 AM

## 2021-01-16 ENCOUNTER — Encounter (HOSPITAL_COMMUNITY): Payer: Self-pay | Admitting: Obstetrics & Gynecology

## 2021-01-16 LAB — CBC
HCT: 34.3 % — ABNORMAL LOW (ref 36.0–46.0)
Hemoglobin: 11 g/dL — ABNORMAL LOW (ref 12.0–15.0)
MCH: 25.6 pg — ABNORMAL LOW (ref 26.0–34.0)
MCHC: 32.1 g/dL (ref 30.0–36.0)
MCV: 80 fL (ref 80.0–100.0)
Platelets: 259 10*3/uL (ref 150–400)
RBC: 4.29 MIL/uL (ref 3.87–5.11)
RDW: 14.1 % (ref 11.5–15.5)
WBC: 6.5 10*3/uL (ref 4.0–10.5)
nRBC: 0 % (ref 0.0–0.2)

## 2021-01-16 LAB — COMPREHENSIVE METABOLIC PANEL
ALT: 14 U/L (ref 0–44)
AST: 16 U/L (ref 15–41)
Albumin: 2.6 g/dL — ABNORMAL LOW (ref 3.5–5.0)
Alkaline Phosphatase: 101 U/L (ref 38–126)
Anion gap: 8 (ref 5–15)
BUN: 6 mg/dL (ref 6–20)
CO2: 21 mmol/L — ABNORMAL LOW (ref 22–32)
Calcium: 8.8 mg/dL — ABNORMAL LOW (ref 8.9–10.3)
Chloride: 105 mmol/L (ref 98–111)
Creatinine, Ser: 0.74 mg/dL (ref 0.44–1.00)
GFR, Estimated: >60 mL/min (ref >60)
Glucose, Bld: 103 mg/dL — ABNORMAL HIGH (ref 70–99)
Potassium: 3.9 mmol/L (ref 3.5–5.1)
Sodium: 134 mmol/L — ABNORMAL LOW (ref 135–145)
Total Bilirubin: 0.4 mg/dL (ref 0.3–1.2)
Total Protein: 5.5 g/dL — ABNORMAL LOW (ref 6.5–8.1)

## 2021-01-16 MED ORDER — NIFEDIPINE ER OSMOTIC RELEASE 30 MG PO TB24
30.0000 mg | ORAL_TABLET | Freq: Every day | ORAL | Status: DC
  Administered 2021-01-16 – 2021-01-19 (×4): 30 mg via ORAL
  Filled 2021-01-16 (×4): qty 1

## 2021-01-16 MED ORDER — BETAMETHASONE SOD PHOS & ACET 6 (3-3) MG/ML IJ SUSP
12.0000 mg | Freq: Once | INTRAMUSCULAR | Status: AC
  Administered 2021-01-16: 12 mg via INTRAMUSCULAR
  Filled 2021-01-16: qty 5

## 2021-01-16 NOTE — Plan of Care (Signed)
  Problem: Education: Goal: Knowledge of disease or condition will improve Outcome: Progressing   Problem: Clinical Measurements: Goal: Complications related to the disease process, condition or treatment will be avoided or minimized Outcome: Progressing   Problem: Education: Goal: Knowledge of disease or condition will improve Outcome: Progressing   Problem: Education: Goal: Knowledge of General Education information will improve Description: Including pain rating scale, medication(s)/side effects and non-pharmacologic comfort measures Outcome: Completed/Met   Problem: Activity: Goal: Risk for activity intolerance will decrease Outcome: Completed/Met

## 2021-01-16 NOTE — Plan of Care (Signed)
  Problem: Health Behavior/Discharge Planning: Goal: Ability to manage health-related needs will improve Outcome: Progressing   Problem: Clinical Measurements: Goal: Ability to maintain clinical measurements within normal limits will improve Outcome: Progressing Goal: Will remain free from infection Outcome: Progressing Goal: Diagnostic test results will improve Outcome: Progressing Goal: Respiratory complications will improve Outcome: Progressing Goal: Cardiovascular complication will be avoided Outcome: Progressing   Problem: Elimination: Goal: Will not experience complications related to bowel motility Outcome: Progressing   Problem: Pain Managment: Goal: General experience of comfort will improve Outcome: Progressing   Problem: Safety: Goal: Ability to remain free from injury will improve Outcome: Progressing   Problem: Skin Integrity: Goal: Risk for impaired skin integrity will decrease Outcome: Progressing   Problem: Education: Goal: Knowledge of disease or condition will improve Outcome: Progressing Goal: Knowledge of the prescribed therapeutic regimen will improve Outcome: Progressing Goal: Individualized Educational Video(s) Outcome: Progressing   Problem: Clinical Measurements: Goal: Complications related to the disease process, condition or treatment will be avoided or minimized Outcome: Progressing   Problem: Education: Goal: Knowledge of disease or condition will improve Outcome: Progressing Goal: Knowledge of the prescribed therapeutic regimen will improve Outcome: Progressing   Problem: Fluid Volume: Goal: Peripheral tissue perfusion will improve Outcome: Progressing   Problem: Clinical Measurements: Goal: Complications related to disease process, condition or treatment will be avoided or minimized Outcome: Progressing

## 2021-01-16 NOTE — Progress Notes (Signed)
FACULTY PRACTICE ANTEPARTUM(COMPREHENSIVE) NOTE  Lacey [name] is a 20 y.o. G1P0 with Estimated Date of Delivery: 03/02/21   By  LMP [redacted]w[redacted]d  who is admitted with mo/di twins, IUGR x2 with preeclampsia- no severe features.      Fetal presentation is breech/oblique. Length of Stay:  1  Days  Date of admission:01/14/2021  Subjective: Pt notes a mild headache this am- she has not yet taken any medication.  Previous HA resolved with tylenol.  Denies blurry vision.  No RUQ pain. Patient reports the fetal movement as active. Patient reports uterine contraction  activity as none. Patient reports  vaginal bleeding as none. Patient describes fluid per vagina as None.  Vitals:  Blood pressure (!) 148/102, pulse 79, temperature 98.8 F (37.1 C), temperature source Oral, resp. rate 18, height 5\' 1"  (1.549 m), weight 100.2 kg, last menstrual period 05/11/2020, SpO2 100 %. Vitals:   01/16/21 0604 01/16/21 0839 01/16/21 0912 01/16/21 0913  BP: (!) 144/92 (!) 154/103 (!) 152/100 (!) 148/102  Pulse: 90 89 77 79  Resp: 18 18    Temp: 98.4 F (36.9 C) 98.8 F (37.1 C)    TempSrc: Oral Oral    SpO2: 100% 100%    Weight:      Height:       Physical Examination:  General appearance - alert, well appearing, and in no distress Mental status - normal mood, behavior, speech, dress, motor activity, and thought processes Chest - clear to auscultation, no wheezes, rales or rhonchi, symmetric air entry Heart - normal rate and regular rhythm Abdomen - gravid, soft and non-tender, no RUQ pain Musculoskeletal - no calf tenderness Extremities - no pedal edema noted Skin - warm and dry   Fetal Monitoring:  Twin A- 140, moderate variability + accels no decels Twin B: 150, moderate variability, +accels, no decels Reactive x 2  Labs:  Results for orders placed or performed during the hospital encounter of 01/14/21 (from the past 24 hour(s))  Urine rapid drug screen (hosp performed)   Collection Time:  01/15/21 10:25 PM  Result Value Ref Range   Opiates NONE DETECTED NONE DETECTED   Cocaine NONE DETECTED NONE DETECTED   Benzodiazepines NONE DETECTED NONE DETECTED   Amphetamines NONE DETECTED NONE DETECTED   Tetrahydrocannabinol POSITIVE (A) NONE DETECTED   Barbiturates NONE DETECTED NONE DETECTED    Imaging Studies:    [place] MFM UA CORD DOPPLER  Result Date: 01/15/2021 ----------------------------------------------------------------------  OBSTETRICS REPORT                       (Signed Final 01/15/2021 05:25 pm) ---------------------------------------------------------------------- Patient Info  ID #:       [id number]                          D.O.B.:  2001/06/24 (19 yrs)  Name:       Lacey [name]                Visit Date: 01/15/2021 11:13 am ---------------------------------------------------------------------- Performed By  Attending:        Sander Nephew      Ref. Address:     [place]                    [address]  [address]  Performed By:     Valda Favia          Secondary Phy.:   [facility]                    RDMS                                                             Care  Referred By:      Craig Guess DAVIS          [facility]                    MD                                       [place] ---------------------------------------------------------------------- Orders  #  Description                           Code        Ordered By  1  [place] MFM UA CORD DOPPLER                76820.02    Nikita Humble  2  [place] MFM UA DOPPLER ADDL                [id number]    Robi Mitter     GEST RE EVAL ----------------------------------------------------------------------  #  Order #                     Accession #                Episode #  1  [id number]                   [id number]                 [id number]  2  [id number]                   [id number]                 [id number]  ---------------------------------------------------------------------- Indications  Maternal care for known or suspected poor      O36.5931  fetal growth, third trimester, fetus 1 IUGR  Pre-eclampsia                                  O14.90  [redacted] weeks gestation of pregnancy                123456  Obesity complicating pregnancy, third          O99.213  trimester (BMI 41)  Anemia during pregnancy in third trimester     O99.013  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)  Substance abuse affecting pregnancy,           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  antepartum  Low Risk NIPS(Negative AFP) ---------------------------------------------------------------------- Fetal Evaluation (Fetus A)  Num Of Fetuses:         2  Fetal Heart Rate(bpm):  152  Cardiac Activity:       Observed  Fetal Lie:              Lower Fetus  Presentation:           Breech  Placenta:               Anterior  Membrane Desc:      Dividing Membrane seen  Amniotic Fluid  AFI FV:      Within normal limits                              Largest Pocket(cm)                              4.4 ---------------------------------------------------------------------- OB History  Gravidity:    1 ---------------------------------------------------------------------- Gestational Age (Fetus A)  Best:          33w 5d     Det. By:  U/S C R L Fetus A        EDD:   02/28/21                                      (07/10/20) ---------------------------------------------------------------------- Anatomy (Fetus A)  Thoracic:              Appears normal         Bladder:                Appears normal  Stomach:               Appears normal, left                         sided ---------------------------------------------------------------------- Doppler - Fetal Vessels (Fetus A)  Umbilical Artery   S/D     %tile      RI    %tile      PI    %tile            ADFV    RDFV   2.57       51    0.61       57    0.91       59                N        N ---------------------------------------------------------------------- Fetal Evaluation (Fetus B)  Num Of Fetuses:         2  Fetal Heart Rate(bpm):  141  Cardiac Activity:       Observed  Fetal Lie:              Upper Fetus  Presentation:           Transverse, head to maternal left  Placenta:               Anterior  Membrane Desc:      Dividing Membrane seen  Amniotic Fluid  AFI FV:      Within normal limits                              Largest Pocket(cm)  5.1 ---------------------------------------------------------------------- Gestational Age (Fetus B)  Best:          33w 5d     Det. By:  U/S C R L Fetus A        EDD:   02/28/21                                      (07/10/20) ---------------------------------------------------------------------- Doppler - Fetal Vessels (Fetus B)  Umbilical Artery   S/D     %tile      RI    %tile      PI    %tile            ADFV    RDFV   3.28       85     0.7       89    1.05       81                N       N ---------------------------------------------------------------------- Impression  Antenatal testing for monochorionic diamnoitic twin  pregnancy FGR in Twin A and B.  Twin A normal stomach, amniotic fluid, bladder and BPP 8/8-  Maternal left, Breech  Twin B normal stomach, amniotic fluid, bladder and BPP 8/8-  Maternal right, Transverse  UA Dopplers are normal without evidence of AEDF or REDF. ---------------------------------------------------------------------- Recommendations  Continue weekly testing with UA Dopplers.  Clincal correlation recommended.  Scheduled delivery on 5/22 ----------------------------------------------------------------------               Sander Nephew, MD Electronically Signed Final Report   01/15/2021 05:25 pm ----------------------------------------------------------------------  [place] MFM UA DOPPLER ADDL GEST RE EVAL  Result Date:  01/15/2021 ----------------------------------------------------------------------  OBSTETRICS REPORT                       (Signed Final 01/15/2021 05:25 pm) ---------------------------------------------------------------------- Patient Info  ID #:       [id number]                          D.O.B.:  12-02-2000 (19 yrs)  Name:       Lacey [name]                Visit Date: 01/15/2021 11:13 am ---------------------------------------------------------------------- Performed By  Attending:        Sander Nephew      Ref. Address:     [place]                    MD                                                             Rd  Performed By:     Valda Favia          Secondary Phy.:   [facility]                    RDMS  Care  Referred By:      Craig Guess DAVIS          [facility]                    MD                                       [place] ---------------------------------------------------------------------- Orders  #  Description                           Code        Ordered By  1  [place] MFM UA CORD DOPPLER                76820.02    Ericka Marcellus  2  [place] MFM UA DOPPLER ADDL                24268.34    Kandy Towery     GEST RE EVAL ----------------------------------------------------------------------  #  Order #                     Accession #                Episode #  1  [id number]                   [id number]                 [id number]  2  [id number]                   [id number]                 [id number] ---------------------------------------------------------------------- Indications  Maternal care for known or suspected poor      O36.5931  fetal growth, third trimester, fetus 1 IUGR  Pre-eclampsia                                  O14.90  [redacted] weeks gestation of pregnancy                E7M.09  Obesity complicating pregnancy, third          O99.213  trimester (BMI 41)  Anemia during pregnancy in third trimester      O99.013  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)  Substance abuse affecting pregnancy,           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  antepartum  Low Risk NIPS(Negative AFP) ---------------------------------------------------------------------- Fetal Evaluation (Fetus A)  Num Of Fetuses:         2  Fetal Heart Rate(bpm):  152  Cardiac Activity:       Observed  Fetal Lie:              Lower Fetus  Presentation:           Breech  Placenta:               Anterior  Membrane Desc:      Dividing Membrane seen  Amniotic Fluid  AFI FV:      Within normal limits  Largest Pocket(cm)                              4.4 ---------------------------------------------------------------------- OB History  Gravidity:    1 ---------------------------------------------------------------------- Gestational Age (Fetus A)  Best:          33w 5d     Det. By:  U/S C R L Fetus A        EDD:   02/28/21                                      (07/10/20) ---------------------------------------------------------------------- Anatomy (Fetus A)  Thoracic:              Appears normal         Bladder:                Appears normal  Stomach:               Appears normal, left                         sided ---------------------------------------------------------------------- Doppler - Fetal Vessels (Fetus A)  Umbilical Artery   S/D     %tile      RI    %tile      PI    %tile            ADFV    RDFV   2.57       51    0.61       57    0.91       59                N       N ---------------------------------------------------------------------- Fetal Evaluation (Fetus B)  Num Of Fetuses:         2  Fetal Heart Rate(bpm):  141  Cardiac Activity:       Observed  Fetal Lie:              Upper Fetus  Presentation:           Transverse, head to maternal left  Placenta:               Anterior  Membrane Desc:      Dividing Membrane seen  Amniotic Fluid  AFI FV:      Within normal limits                               Largest Pocket(cm)                              5.1 ---------------------------------------------------------------------- Gestational Age (Fetus B)  Best:          33w 5d     Det. By:  U/S C R L Fetus A        EDD:   02/28/21                                      (07/10/20) ---------------------------------------------------------------------- Doppler - Fetal Vessels (Fetus B)  Umbilical Artery   S/D     %tile      RI    %  tile      PI    %tile            ADFV    RDFV   3.28       85     0.7       89    1.05       81                N       N ---------------------------------------------------------------------- Impression  Antenatal testing for monochorionic diamnoitic twin  pregnancy FGR in Twin A and B.  Twin A normal stomach, amniotic fluid, bladder and BPP 8/8-  Maternal left, Breech  Twin B normal stomach, amniotic fluid, bladder and BPP 8/8-  Maternal right, Transverse  UA Dopplers are normal without evidence of AEDF or REDF. ---------------------------------------------------------------------- Recommendations  Continue weekly testing with UA Dopplers.  Clincal correlation recommended.  Scheduled delivery on 5/22 ----------------------------------------------------------------------               Sander Nephew, MD Electronically Signed Final Report   01/15/2021 05:25 pm ----------------------------------------------------------------------    Medications:  Scheduled . aspirin EC  81 mg Oral Daily  . docusate sodium  100 mg Oral Daily  . NIFEdipine  30 mg Oral Daily  . prenatal multivitamin  1 tablet Oral Q1200  . valACYclovir  1,000 mg Oral Daily   I have reviewed the patient's current medications.  ASSESSMENT: G1P0 [redacted]w[redacted]d Estimated Date of Delivery: 03/02/21  due to mono/di twins, IUGR x2, now diagnosed with preeclampia without severe features  PLAN: 1) Preeclampsia no severe features -labs as above, mild HA noted- will monitor closely today -continue ASA  daily -repeat labs as clinically indicated and/or twice weekly  2) IUGR -FWB- NICHD- Cat. 1 x 2 -BPP and dopplers weekly -BMZ course given  3) Maternal well being -h/o polysubstance abuse- +THC on arrival -h/o depression and bipolar-  no meds or counseling currently, mood appropriate  DISPO: Plan for in-patient monitoring until delivery @ 34wks or may consider earlier delivery pending maternal/fetal well-being.  Will continue with close observation as outlined above.  Janyth Pupa, DO Attending [place], [facility] for [employer], [place]

## 2021-01-17 ENCOUNTER — Encounter (HOSPITAL_COMMUNITY)
Admission: RE | Admit: 2021-01-17 | Discharge: 2021-01-17 | Disposition: A | Discharge status: Home / Self Care | Payer: Medicaid Other | Source: Ambulatory Visit | Attending: Obstetrics and Gynecology | Admitting: Obstetrics and Gynecology

## 2021-01-17 ENCOUNTER — Other Ambulatory Visit (HOSPITAL_COMMUNITY): Payer: Medicaid Other

## 2021-01-17 MED ORDER — CEFAZOLIN IN SODIUM CHLORIDE 3-0.9 GM/100ML-% IV SOLN: 3.0000 g | INTRAVENOUS | Status: AC

## 2021-01-17 MED ORDER — LACTATED RINGERS IV SOLN: INTRAVENOUS | Status: DC

## 2021-01-17 MED ORDER — DOXYLAMINE SUCCINATE (SLEEP) 25 MG PO TABS
25.0000 mg | ORAL_TABLET | Freq: Every day | ORAL | Status: DC
  Administered 2021-01-17 – 2021-01-18 (×2): 25 mg via ORAL
  Filled 2021-01-17 (×2): qty 1

## 2021-01-17 MED ORDER — DOXYLAMINE SUCCINATE (SLEEP) 25 MG PO TABS: 25.0000 mg | ORAL_TABLET | Freq: Two times a day (BID) | ORAL | Status: DC

## 2021-01-17 MED ORDER — VITAMIN B-6 25 MG PO TABS
25.0000 mg | ORAL_TABLET | Freq: Two times a day (BID) | ORAL | Status: DC
  Administered 2021-01-17 – 2021-01-18 (×3): 25 mg via ORAL
  Filled 2021-01-17 (×3): qty 1

## 2021-01-17 NOTE — Progress Notes (Signed)
FACULTY PRACTICE ANTEPARTUM(COMPREHENSIVE) NOTE  Lacey [name] is a 20 y.o. G1P0 with Estimated Date of Delivery: 03/02/21   By  LMP [redacted]w[redacted]d  who is admitted with mo/di twins, IUGR x2 with preeclampsia- no severe features.      Fetal presentation is breech/oblique. Length of Stay:  2  Days  Date of admission:01/14/2021  Subjective: Resting comfortably in bed.  Notes some nausea in the am- previously taking Diclegis. Denies headache Denies blurry vision.  No RUQ pain. Patient reports the fetal movement as active. Patient reports uterine contraction  activity as none. Patient reports  vaginal bleeding as none. Patient describes fluid per vagina as None.  Vitals:  Blood pressure (!) 142/82, pulse 93, temperature 98.9 F (37.2 C), temperature source Oral, resp. rate 15, height 5\' 1"  (1.549 m), weight 100.2 kg, last menstrual period 05/11/2020, SpO2 99 %. Vitals:   01/16/21 2333 01/16/21 2334 01/17/21 0002 01/17/21 0456  BP: (!) 151/94 (!) 164/90 (!) 154/95 (!) 142/82  Pulse: 77 75 74 93  Resp:    15  Temp:    98.9 F (37.2 C)  TempSrc:    Oral  SpO2:    99%  Weight:      Height:       Physical Examination:  General appearance - alert, well appearing, and in no distress Mental status - normal mood, behavior, speech, dress, motor activity, and thought processes Chest - clear to auscultation Heart - normal rate and regular rhythm Abdomen - gravid, soft and non-tender, no RUQ pain Musculoskeletal - no calf tenderness Extremities - no pedal edema noted Skin - warm and dry   Fetal Monitoring:  Twin A- 145, moderate variability, +accels, no decels Twin B: 150, moderate variability, +accels, no decels Reactive x 2  Labs:  No results found for this or any previous visit (from the past 24 hour(s)).  Imaging Studies:    [place] MFM UA CORD DOPPLER  Result Date: 01/15/2021 ----------------------------------------------------------------------  OBSTETRICS REPORT                        (Signed Final 01/15/2021 05:25 pm) ---------------------------------------------------------------------- Patient Info  ID #:       [id number]                          D.O.B.:  24-Jan-2001 (19 yrs)  Name:       Lacey [name]                Visit Date: 01/15/2021 11:13 am ---------------------------------------------------------------------- Performed By  Attending:        Sander Nephew      Ref. Address:     [place]                    MD                                                             Rd  Performed By:     Valda Favia          Secondary Phy.:   [facility]                    RDMS  Care  Referred By:      Craig Guess DAVIS          [facility]                    MD                                       [place] ---------------------------------------------------------------------- Orders  #  Description                           Code        Ordered By  1  [place] MFM UA CORD DOPPLER                76820.02    Hale Chalfin  2  [place] MFM UA DOPPLER ADDL                [id number]    Sibyl Mikula     GEST RE EVAL ----------------------------------------------------------------------  #  Order #                     Accession #                Episode #  1  [id number]                   [id number]                 [id number]  2  [id number]                   [id number]                 [id number] ---------------------------------------------------------------------- Indications  Maternal care for known or suspected poor      O36.5931  fetal growth, third trimester, fetus 1 IUGR  Pre-eclampsia                                  O14.90  [redacted] weeks gestation of pregnancy                123456  Obesity complicating pregnancy, third          O99.213  trimester (BMI 41)  Anemia during pregnancy in third trimester     O99.013  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)   Substance abuse affecting pregnancy,           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  antepartum  Low Risk NIPS(Negative AFP) ---------------------------------------------------------------------- Fetal Evaluation (Fetus A)  Num Of Fetuses:         2  Fetal Heart Rate(bpm):  152  Cardiac Activity:       Observed  Fetal Lie:              Lower Fetus  Presentation:           Breech  Placenta:               Anterior  Membrane Desc:      Dividing Membrane seen  Amniotic Fluid  AFI FV:      Within normal limits  Largest Pocket(cm)                              4.4 ---------------------------------------------------------------------- OB History  Gravidity:    1 ---------------------------------------------------------------------- Gestational Age (Fetus A)  Best:          33w 5d     Det. By:  U/S C R L Fetus A        EDD:   02/28/21                                      (07/10/20) ---------------------------------------------------------------------- Anatomy (Fetus A)  Thoracic:              Appears normal         Bladder:                Appears normal  Stomach:               Appears normal, left                         sided ---------------------------------------------------------------------- Doppler - Fetal Vessels (Fetus A)  Umbilical Artery   S/D     %tile      RI    %tile      PI    %tile            ADFV    RDFV   2.57       51    0.61       57    0.91       59                N       N ---------------------------------------------------------------------- Fetal Evaluation (Fetus B)  Num Of Fetuses:         2  Fetal Heart Rate(bpm):  141  Cardiac Activity:       Observed  Fetal Lie:              Upper Fetus  Presentation:           Transverse, head to maternal left  Placenta:               Anterior  Membrane Desc:      Dividing Membrane seen  Amniotic Fluid  AFI FV:      Within normal limits                              Largest Pocket(cm)                              5.1  ---------------------------------------------------------------------- Gestational Age (Fetus B)  Best:          33w 5d     Det. By:  U/S C R L Fetus A        EDD:   02/28/21                                      (07/10/20) ---------------------------------------------------------------------- Doppler - Fetal Vessels (Fetus B)  Umbilical Artery   S/D     %tile      RI    %  tile      PI    %tile            ADFV    RDFV   3.28       85     0.7       89    1.05       81                N       N ---------------------------------------------------------------------- Impression  Antenatal testing for monochorionic diamnoitic twin  pregnancy FGR in Twin A and B.  Twin A normal stomach, amniotic fluid, bladder and BPP 8/8-  Maternal left, Breech  Twin B normal stomach, amniotic fluid, bladder and BPP 8/8-  Maternal right, Transverse  UA Dopplers are normal without evidence of AEDF or REDF. ---------------------------------------------------------------------- Recommendations  Continue weekly testing with UA Dopplers.  Clincal correlation recommended.  Scheduled delivery on 5/22 ----------------------------------------------------------------------               Sander Nephew, MD Electronically Signed Final Report   01/15/2021 05:25 pm ----------------------------------------------------------------------  [place] MFM UA DOPPLER ADDL GEST RE EVAL  Result Date: 01/15/2021 ----------------------------------------------------------------------  OBSTETRICS REPORT                       (Signed Final 01/15/2021 05:25 pm) ---------------------------------------------------------------------- Patient Info  ID #:       [id number]                          D.O.B.:  01-10-2001 (19 yrs)  Name:       Lacey [name]                Visit Date: 01/15/2021 11:13 am ---------------------------------------------------------------------- Performed By  Attending:        Sander Nephew      Ref. Address:     [place]                     MD                                                             Rd  Performed By:     Valda Favia          Secondary Phy.:   [facility]                    RDMS                                                             Care  Referred By:      Craig Guess DAVIS          [facility]                    MD  [place] ---------------------------------------------------------------------- Orders  #  Description                           Code        Ordered By  1  [place] MFM UA CORD DOPPLER                76820.02    Malaiyah Achorn  2  [place] MFM UA DOPPLER ADDL                [id number]    Nelson Noone     GEST RE EVAL ----------------------------------------------------------------------  #  Order #                     Accession #                Episode #  1  [id number]                   [id number]                 [id number]  2  [id number]                   [id number]                 [id number] ---------------------------------------------------------------------- Indications  Maternal care for known or suspected poor      O36.5931  fetal growth, third trimester, fetus 1 IUGR  Pre-eclampsia                                  O14.90  [redacted] weeks gestation of pregnancy                123456  Obesity complicating pregnancy, third          O99.213  trimester (BMI 41)  Anemia during pregnancy in third trimester     O99.013  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)  Substance abuse affecting pregnancy,           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  antepartum  Low Risk NIPS(Negative AFP) ---------------------------------------------------------------------- Fetal Evaluation (Fetus A)  Num Of Fetuses:         2  Fetal Heart Rate(bpm):  152  Cardiac Activity:       Observed  Fetal Lie:              Lower Fetus  Presentation:           Breech  Placenta:               Anterior  Membrane Desc:      Dividing Membrane seen  Amniotic Fluid   AFI FV:      Within normal limits                              Largest Pocket(cm)                              4.4 ---------------------------------------------------------------------- OB History  Gravidity:    1 ---------------------------------------------------------------------- Gestational Age (Fetus A)  Best:          33w 5d     Det. By:  U/S C R L Fetus A        EDD:   02/28/21                                      (07/10/20) ---------------------------------------------------------------------- Anatomy (Fetus A)  Thoracic:              Appears normal         Bladder:                Appears normal  Stomach:               Appears normal, left                         sided ---------------------------------------------------------------------- Doppler - Fetal Vessels (Fetus A)  Umbilical Artery   S/D     %tile      RI    %tile      PI    %tile            ADFV    RDFV   2.57       51    0.61       57    0.91       59                N       N ---------------------------------------------------------------------- Fetal Evaluation (Fetus B)  Num Of Fetuses:         2  Fetal Heart Rate(bpm):  141  Cardiac Activity:       Observed  Fetal Lie:              Upper Fetus  Presentation:           Transverse, head to maternal left  Placenta:               Anterior  Membrane Desc:      Dividing Membrane seen  Amniotic Fluid  AFI FV:      Within normal limits                              Largest Pocket(cm)                              5.1 ---------------------------------------------------------------------- Gestational Age (Fetus B)  Best:          33w 5d     Det. By:  U/S C R L Fetus A        EDD:   02/28/21                                      (07/10/20) ---------------------------------------------------------------------- Doppler - Fetal Vessels (Fetus B)  Umbilical Artery   S/D     %tile      RI    %tile      PI    %tile            ADFV    RDFV   3.28       85     0.7       89    1.05       81  N       N  ---------------------------------------------------------------------- Impression  Antenatal testing for monochorionic diamnoitic twin  pregnancy FGR in Twin A and B.  Twin A normal stomach, amniotic fluid, bladder and BPP 8/8-  Maternal left, Breech  Twin B normal stomach, amniotic fluid, bladder and BPP 8/8-  Maternal right, Transverse  UA Dopplers are normal without evidence of AEDF or REDF. ---------------------------------------------------------------------- Recommendations  Continue weekly testing with UA Dopplers.  Clincal correlation recommended.  Scheduled delivery on 5/22 ----------------------------------------------------------------------               Sander Nephew, MD Electronically Signed Final Report   01/15/2021 05:25 pm ----------------------------------------------------------------------    Medications:  Scheduled . aspirin EC  81 mg Oral Daily  . docusate sodium  100 mg Oral Daily  . NIFEdipine  30 mg Oral Daily  . prenatal multivitamin  1 tablet Oral Q1200  . valACYclovir  1,000 mg Oral Daily   I have reviewed the patient's current medications.  ASSESSMENT: G1P0 [redacted]w[redacted]d Estimated Date of Delivery: 03/02/21  due to mono/di twins, IUGR x2, now diagnosed with preeclampia without severe features  PLAN: 1) Preeclampsia no severe features -on Procardia XL 30mg  daily.  Severe range BP x 1, will continue to closely monitor -continue ASA daily -repeat labs as clinically indicated and/or twice weekly- to be collected on Sunday  2) IUGR -FWB- NICHD- Cat. 1 x 2 -BPP and dopplers weekly- completed on 5/18 -BMZ course given  3) Maternal well being -h/o polysubstance abuse- +THC on arrival -h/o depression and bipolar-  no meds or counseling currently, mood appropriate  DISPO: Plan for in-patient monitoring until delivery @ 34wks or may consider earlier delivery pending maternal/fetal well-being.  Will continue with close observation as outlined above.  Janyth Pupa,  DO Attending [place], [facility] for [employer], [place]

## 2021-01-17 NOTE — Anesthesia Preprocedure Evaluation (Addendum)
Anesthesia Evaluation  Patient identified by MRN, date of birth, ID band Patient awake    Reviewed: Allergy & Precautions, NPO status , Patient's Chart, lab work & pertinent test results  History of Anesthesia Complications Negative for: history of anesthetic complications  Airway Mallampati: II  TM Distance: >3 FB Neck ROM: Full    Dental no notable dental hx.    Pulmonary asthma , former smoker,    Pulmonary exam normal        Cardiovascular negative cardio ROS Normal cardiovascular exam     Neuro/Psych Depression Bipolar Disorder negative neurological ROS     GI/Hepatic negative GI ROS, (+)     substance abuse (remote hx of polysubstance abuse)  marijuana use,   Endo/Other  Morbid obesity  Renal/GU negative Renal ROS  negative genitourinary   Musculoskeletal negative musculoskeletal ROS (+)   Abdominal   Peds  Hematology  (+) anemia , Hgb 11.2, plt 237   Anesthesia Other Findings Day of surgery medications reviewed with patient.  Reproductive/Obstetrics (+) Pregnancy (twin gestation, severe preE )                          Anesthesia Physical Anesthesia Plan  ASA: III  Anesthesia Plan: Spinal   Post-op Pain Management:    Induction:   PONV Risk Score and Plan: 4 or greater and Treatment may vary due to age or medical condition, Ondansetron and Dexamethasone  Airway Management Planned: Natural Airway  Additional Equipment: None  Intra-op Plan:   Post-operative Plan:   Informed Consent: I have reviewed the patients History and Physical, chart, labs and discussed the procedure including the risks, benefits and alternatives for the proposed anesthesia with the patient or authorized representative who has indicated his/her understanding and acceptance.       Plan Discussed with: CRNA  Anesthesia Plan Comments:        Anesthesia Quick Evaluation

## 2021-01-18 DIAGNOSIS — Z3A33 33 weeks gestation of pregnancy: Secondary | ICD-10-CM

## 2021-01-18 DIAGNOSIS — O365932 Maternal care for other known or suspected poor fetal growth, third trimester, fetus 2: Secondary | ICD-10-CM | POA: Diagnosis not present

## 2021-01-18 DIAGNOSIS — O1413 Severe pre-eclampsia, third trimester: Secondary | ICD-10-CM | POA: Diagnosis not present

## 2021-01-18 DIAGNOSIS — O365931 Maternal care for other known or suspected poor fetal growth, third trimester, fetus 1: Secondary | ICD-10-CM | POA: Diagnosis not present

## 2021-01-18 DIAGNOSIS — O30033 Twin pregnancy, monochorionic/diamniotic, third trimester: Secondary | ICD-10-CM | POA: Diagnosis not present

## 2021-01-18 MED ORDER — LABETALOL HCL 5 MG/ML IV SOLN
INTRAVENOUS | Status: AC
  Filled 2021-01-18: qty 4

## 2021-01-18 MED ORDER — LABETALOL HCL 5 MG/ML IV SOLN: 80.0000 mg | INTRAVENOUS | Status: DC | PRN

## 2021-01-18 MED ORDER — LABETALOL HCL 5 MG/ML IV SOLN: 40.0000 mg | INTRAVENOUS | Status: DC | PRN

## 2021-01-18 MED ORDER — HYDRALAZINE HCL 20 MG/ML IJ SOLN: 10.0000 mg | INTRAMUSCULAR | Status: DC | PRN

## 2021-01-18 MED ORDER — LABETALOL HCL 5 MG/ML IV SOLN
20.0000 mg | INTRAVENOUS | Status: DC | PRN
  Administered 2021-01-18: 20 mg via INTRAVENOUS

## 2021-01-18 NOTE — Progress Notes (Signed)
Patient ID: Lacey [name], adult   DOB: 02-Nov-2000, 20 y.o.   MRN: [id number] [place]) NOTE  Lacey [name] is a 20 y.o. G1P0 with Estimated Date of Delivery: 03/02/21   By   [redacted]w[redacted]d  who is admitted for management of severe pre eclampsia in mono/di twin pregnancy complicate dby FGR of both twins, no evidence of TTT, 12% discordance  Fetal presentation is non vertex. Length of Stay:  3  Days  Date of admission:01/14/2021  Subjective: No complaints specifically no CNS symptoms Patient reports the fetal movement as active. Patient reports uterine contraction  activity as none. Patient reports  vaginal bleeding as none. Patient describes fluid per vagina as None.  Vitals:  Blood pressure (!) 143/93, pulse 80, temperature 98 F (36.7 C), temperature source Oral, resp. rate 17, height 5\' 1"  (1.549 m), weight 100.2 kg, last menstrual period 05/11/2020, SpO2 100 %. Vitals:   01/17/21 1643 01/17/21 1943 01/18/21 0358 01/18/21 0743  BP: (!) 159/103 132/71 (!) 142/74 (!) 143/93  Pulse: 88 89 79 80  Resp: 17   17  Temp: 99.1 F (37.3 C) 98.4 F (36.9 C) 98.5 F (36.9 C) 98 F (36.7 C)  TempSrc: Oral Oral Oral Oral  SpO2: 100%   100%  Weight:      Height:       Physical Examination:  General appearance - alert, well appearing, and in no distress Abdomen - soft, nontender, nondistended, no masses or organomegaly Fundal Height:  size greater than dates Pelvic Exam:  examination not indicated Cervical Exam: Not evaluated. and found to be / / and fetal presentation is. Extremities: extremities normal, atraumatic, no cyanosis or edema with DTRs 3+ bilaterally Membranes:intact  Fetal Monitoring:  Baseline: 140s bpm, Variability: Good {> 6 bpm), Accelerations: Reactive and Decelerations: Absent   Reactive for both twins  Labs:  No results found for this or any previous visit (from the past 24 hour(s)).  Imaging Studies:    [place] MFM UA CORD DOPPLER  Result  Date: 01/15/2021 ----------------------------------------------------------------------  OBSTETRICS REPORT                       (Signed Final 01/15/2021 05:25 pm) ---------------------------------------------------------------------- Patient Info  ID #:       [id number]                          D.O.B.:  May 22, 2001 (20 yrs)  Name:       Lacey [name]                Visit Date: 01/15/2021 11:13 am ---------------------------------------------------------------------- Performed By  Attending:        Sander Nephew      Ref. Address:     [place]                    MD                                                             Rd  Performed By:     Valda Favia          Secondary Phy.:   [facility]  RDMS                                                             Care  Referred By:      Craig Guess DAVIS          [facility]                    MD                                       [place] ---------------------------------------------------------------------- Orders  #  Description                           Code        Ordered By  1  [place] MFM UA CORD DOPPLER                76820.02    JENNIFER OZAN  2  [place] MFM UA DOPPLER ADDL                [id number]    JENNIFER OZAN     GEST RE EVAL ----------------------------------------------------------------------  #  Order #                     Accession #                Episode #  1  [id number]                   [id number]                 [id number]  2  [id number]                   [id number]                 [id number] ---------------------------------------------------------------------- Indications  Maternal care for known or suspected poor      O36.5931  fetal growth, third trimester, fetus 1 IUGR  Pre-eclampsia                                  O14.90  [redacted] weeks gestation of pregnancy                123456  Obesity complicating pregnancy, third          O99.213  trimester (BMI 41)  Anemia during pregnancy in third  trimester     O99.013  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)  Substance abuse affecting pregnancy,           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  antepartum  Low Risk NIPS(Negative AFP) ---------------------------------------------------------------------- Fetal Evaluation (Fetus A)  Num Of Fetuses:         2  Fetal Heart Rate(bpm):  152  Cardiac Activity:       Observed  Fetal Lie:  Lower Fetus  Presentation:           Breech  Placenta:               Anterior  Membrane Desc:      Dividing Membrane seen  Amniotic Fluid  AFI FV:      Within normal limits                              Largest Pocket(cm)                              4.4 ---------------------------------------------------------------------- OB History  Gravidity:    1 ---------------------------------------------------------------------- Gestational Age (Fetus A)  Best:          33w 5d     Det. By:  U/S C R L Fetus A        EDD:   02/28/21                                      (07/10/20) ---------------------------------------------------------------------- Anatomy (Fetus A)  Thoracic:              Appears normal         Bladder:                Appears normal  Stomach:               Appears normal, left                         sided ---------------------------------------------------------------------- Doppler - Fetal Vessels (Fetus A)  Umbilical Artery   S/D     %tile      RI    %tile      PI    %tile            ADFV    RDFV   2.57       51    0.61       57    0.91       59                N       N ---------------------------------------------------------------------- Fetal Evaluation (Fetus B)  Num Of Fetuses:         2  Fetal Heart Rate(bpm):  141  Cardiac Activity:       Observed  Fetal Lie:              Upper Fetus  Presentation:           Transverse, head to maternal left  Placenta:               Anterior  Membrane Desc:      Dividing Membrane seen  Amniotic Fluid  AFI FV:      Within  normal limits                              Largest Pocket(cm)                              5.1 ---------------------------------------------------------------------- Gestational Age (Fetus B)  Best:          33w 5d  Det. By:  U/S C R L Fetus A        EDD:   02/28/21                                      (07/10/20) ---------------------------------------------------------------------- Doppler - Fetal Vessels (Fetus B)  Umbilical Artery   S/D     %tile      RI    %tile      PI    %tile            ADFV    RDFV   3.28       85     0.7       89    1.05       81                N       N ---------------------------------------------------------------------- Impression  Antenatal testing for monochorionic diamnoitic twin  pregnancy FGR in Twin A and B.  Twin A normal stomach, amniotic fluid, bladder and BPP 8/8-  Maternal left, Breech  Twin B normal stomach, amniotic fluid, bladder and BPP 8/8-  Maternal right, Transverse  UA Dopplers are normal without evidence of AEDF or REDF. ---------------------------------------------------------------------- Recommendations  Continue weekly testing with UA Dopplers.  Clincal correlation recommended.  Scheduled delivery on 5/22 ----------------------------------------------------------------------               Sander Nephew, MD Electronically Signed Final Report   01/15/2021 05:25 pm ----------------------------------------------------------------------  [place] MFM UA DOPPLER ADDL GEST RE EVAL  Result Date: 01/15/2021 ----------------------------------------------------------------------  OBSTETRICS REPORT                       (Signed Final 01/15/2021 05:25 pm) ---------------------------------------------------------------------- Patient Info  ID #:       [id number]                          D.O.B.:  [birth year] (19 yrs)  Name:       DEYJA SAVINO                Visit Date: 01/15/2021 11:13 am ---------------------------------------------------------------------- Performed By   Attending:        Sander Nephew      Ref. Address:     [place]                    MD                                                             Rd  Performed By:     Valda Favia          Secondary Phy.:   [facility]                    RDMS  Care  Referred By:      Craig Guess DAVIS          [facility]                    MD                                       [place] ---------------------------------------------------------------------- Orders  #  Description                           Code        Ordered By  1  [place] MFM UA CORD DOPPLER                76820.02    JENNIFER OZAN  2  [place] MFM UA DOPPLER ADDL                08657.84    JENNIFER OZAN     GEST RE EVAL ----------------------------------------------------------------------  #  Order #                     Accession #                Episode #  1  [id number]                   [id number]                 [id number]  2  [id number]                   [id number]                 [id number] ---------------------------------------------------------------------- Indications  Maternal care for known or suspected poor      O36.5931  fetal growth, third trimester, fetus 1 IUGR  Pre-eclampsia                                  O14.90  [redacted] weeks gestation of pregnancy                T0Z.60  Obesity complicating pregnancy, third          O99.213  trimester (BMI 41)  Anemia during pregnancy in third trimester     O99.013  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)  Substance abuse affecting pregnancy,           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  antepartum  Low Risk NIPS(Negative AFP) ---------------------------------------------------------------------- Fetal Evaluation (Fetus A)  Num Of Fetuses:         2  Fetal Heart Rate(bpm):  152  Cardiac Activity:       Observed  Fetal Lie:              Lower Fetus  Presentation:            Breech  Placenta:               Anterior  Membrane Desc:      Dividing Membrane seen  Amniotic Fluid  AFI FV:      Within normal limits  Largest Pocket(cm)                              4.4 ---------------------------------------------------------------------- OB History  Gravidity:    1 ---------------------------------------------------------------------- Gestational Age (Fetus A)  Best:          33w 5d     Det. By:  U/S C R L Fetus A        EDD:   02/28/21                                      (07/10/20) ---------------------------------------------------------------------- Anatomy (Fetus A)  Thoracic:              Appears normal         Bladder:                Appears normal  Stomach:               Appears normal, left                         sided ---------------------------------------------------------------------- Doppler - Fetal Vessels (Fetus A)  Umbilical Artery   S/D     %tile      RI    %tile      PI    %tile            ADFV    RDFV   2.57       51    0.61       57    0.91       59                N       N ---------------------------------------------------------------------- Fetal Evaluation (Fetus B)  Num Of Fetuses:         2  Fetal Heart Rate(bpm):  141  Cardiac Activity:       Observed  Fetal Lie:              Upper Fetus  Presentation:           Transverse, head to maternal left  Placenta:               Anterior  Membrane Desc:      Dividing Membrane seen  Amniotic Fluid  AFI FV:      Within normal limits                              Largest Pocket(cm)                              5.1 ---------------------------------------------------------------------- Gestational Age (Fetus B)  Best:          33w 5d     Det. By:  U/S C R L Fetus A        EDD:   02/28/21                                      (07/10/20) ---------------------------------------------------------------------- Doppler - Fetal Vessels (Fetus B)  Umbilical Artery   S/D     %tile      RI    %  tile      PI     %tile            ADFV    RDFV   3.28       85     0.7       89    1.05       81                N       N ---------------------------------------------------------------------- Impression  Antenatal testing for monochorionic diamnoitic twin  pregnancy FGR in Twin A and B.  Twin A normal stomach, amniotic fluid, bladder and BPP 8/8-  Maternal left, Breech  Twin B normal stomach, amniotic fluid, bladder and BPP 8/8-  Maternal right, Transverse  UA Dopplers are normal without evidence of AEDF or REDF. ---------------------------------------------------------------------- Recommendations  Continue weekly testing with UA Dopplers.  Clincal correlation recommended.  Scheduled delivery on 5/22 ----------------------------------------------------------------------               Sander Nephew, MD Electronically Signed Final Report   01/15/2021 05:25 pm ----------------------------------------------------------------------    Medications:  Scheduled . aspirin EC  81 mg Oral Daily  . docusate sodium  100 mg Oral Daily  . doxylamine (Sleep)  25 mg Oral QHS  . NIFEdipine  30 mg Oral Daily  . prenatal multivitamin  1 tablet Oral Q1200  . valACYclovir  1,000 mg Oral Daily  . vitamin B-6  25 mg Oral BID   I have reviewed the patient's current medications.  ASSESSMENT: G1P0 [redacted]w[redacted]d Estimated Date of Delivery: 03/02/21  Patient Active Problem List   Diagnosis Date Noted  . [redacted] weeks gestation of pregnancy 01/14/2021  . Monochorionic diamniotic twin gestation in third trimester 01/14/2021  . Monochorionic diamniotic twin gestation in second trimester 09/11/2020  . Group B streptococcal bacteriuria 08/21/2020  . Hx of herpes simplex type 2 infection 08/14/2020  . Supervision of high risk pregnancy, antepartum 08/14/2020  . Encounter for supervision of high risk pregnancy in first trimester, antepartum 07/10/2020  . Obesity in pregnancy 11/27/2019  . MDD (major depressive disorder), recurrent severe, without  psychosis ([place]) 11/09/2018  . Acute encephalopathy 11/09/2017  . Mild renal insufficiency 11/09/2017  . Anemia 11/09/2017  . Thrombocytosis 11/09/2017  . Other constipation 08/13/2016  . MDD (major depressive disorder), recurrent episode, severe ([place]) 11/21/2013  . ADHD (attention deficit hyperactivity disorder), combined type 11/21/2013    PLAN: Planned Caesarean delivery tomorrow NPO tonight  Florian Buff 01/18/2021,9:03 AM

## 2021-01-19 ENCOUNTER — Encounter (HOSPITAL_COMMUNITY): Payer: Self-pay | Admitting: Obstetrics & Gynecology

## 2021-01-19 ENCOUNTER — Inpatient Hospital Stay (HOSPITAL_COMMUNITY): Payer: Medicaid Other | Admitting: Anesthesiology

## 2021-01-19 ENCOUNTER — Inpatient Hospital Stay (HOSPITAL_COMMUNITY)
Admission: RE | Admit: 2021-01-19 | Payer: Medicaid Other | Source: Home / Self Care | Admitting: Obstetrics and Gynecology

## 2021-01-19 ENCOUNTER — Encounter (HOSPITAL_COMMUNITY)
Admission: AD | Disposition: A | Discharge status: Home / Self Care | Payer: Self-pay | Source: Home / Self Care | Attending: Obstetrics & Gynecology

## 2021-01-19 DIAGNOSIS — O30033 Twin pregnancy, monochorionic/diamniotic, third trimester: Secondary | ICD-10-CM

## 2021-01-19 DIAGNOSIS — O1493 Unspecified pre-eclampsia, third trimester: Secondary | ICD-10-CM | POA: Diagnosis not present

## 2021-01-19 DIAGNOSIS — O328XX2 Maternal care for other malpresentation of fetus, fetus 2: Secondary | ICD-10-CM

## 2021-01-19 DIAGNOSIS — O365932 Maternal care for other known or suspected poor fetal growth, third trimester, fetus 2: Secondary | ICD-10-CM

## 2021-01-19 DIAGNOSIS — Z3A34 34 weeks gestation of pregnancy: Secondary | ICD-10-CM

## 2021-01-19 DIAGNOSIS — O1400 Mild to moderate pre-eclampsia, unspecified trimester: Secondary | ICD-10-CM

## 2021-01-19 DIAGNOSIS — O321XX1 Maternal care for breech presentation, fetus 1: Secondary | ICD-10-CM

## 2021-01-19 DIAGNOSIS — O365931 Maternal care for other known or suspected poor fetal growth, third trimester, fetus 1: Secondary | ICD-10-CM

## 2021-01-19 LAB — COMPREHENSIVE METABOLIC PANEL
ALT: 13 U/L (ref 0–44)
AST: 16 U/L (ref 15–41)
Albumin: 2.5 g/dL — ABNORMAL LOW (ref 3.5–5.0)
Alkaline Phosphatase: 111 U/L (ref 38–126)
Anion gap: 6 (ref 5–15)
BUN: <5 mg/dL — ABNORMAL LOW (ref 6–20)
CO2: 25 mmol/L (ref 22–32)
Calcium: 9.2 mg/dL (ref 8.9–10.3)
Chloride: 107 mmol/L (ref 98–111)
Creatinine, Ser: 0.66 mg/dL (ref 0.44–1.00)
GFR, Estimated: >60 mL/min (ref >60)
Glucose, Bld: 80 mg/dL (ref 70–99)
Potassium: 4.2 mmol/L (ref 3.5–5.1)
Sodium: 138 mmol/L (ref 135–145)
Total Bilirubin: 0.4 mg/dL (ref 0.3–1.2)
Total Protein: 5.4 g/dL — ABNORMAL LOW (ref 6.5–8.1)

## 2021-01-19 LAB — CBC
HCT: 34.5 % — ABNORMAL LOW (ref 36.0–46.0)
HCT: 33.5 % — ABNORMAL LOW (ref 36.0–46.0)
Hemoglobin: 11.2 g/dL — ABNORMAL LOW (ref 12.0–15.0)
Hemoglobin: 10.7 g/dL — ABNORMAL LOW (ref 12.0–15.0)
MCH: 25.7 pg — ABNORMAL LOW (ref 26.0–34.0)
MCH: 25.4 pg — ABNORMAL LOW (ref 26.0–34.0)
MCHC: 32.5 g/dL (ref 30.0–36.0)
MCHC: 31.9 g/dL (ref 30.0–36.0)
MCV: 79.1 fL — ABNORMAL LOW (ref 80.0–100.0)
MCV: 79.6 fL — ABNORMAL LOW (ref 80.0–100.0)
Platelets: 237 10*3/uL (ref 150–400)
Platelets: 257 10*3/uL (ref 150–400)
RBC: 4.36 MIL/uL (ref 3.87–5.11)
RBC: 4.21 MIL/uL (ref 3.87–5.11)
RDW: 14 % (ref 11.5–15.5)
RDW: 14 % (ref 11.5–15.5)
WBC: 6.6 10*3/uL (ref 4.0–10.5)
WBC: 10.6 10*3/uL — ABNORMAL HIGH (ref 4.0–10.5)
nRBC: 0 % (ref 0.0–0.2)
nRBC: 0 % (ref 0.0–0.2)

## 2021-01-19 LAB — TYPE AND SCREEN
ABO/RH(D): O POS
Antibody Screen: NEGATIVE

## 2021-01-19 LAB — CREATININE, SERUM
Creatinine, Ser: 0.65 mg/dL (ref 0.44–1.00)
GFR, Estimated: >60 mL/min (ref >60)

## 2021-01-19 SURGERY — Surgical Case
Anesthesia: Spinal

## 2021-01-19 MED ORDER — ACETAMINOPHEN 160 MG/5ML PO SOLN: 1000.0000 mg | Freq: Once | ORAL | Status: AC

## 2021-01-19 MED ORDER — NIFEDIPINE ER OSMOTIC RELEASE 30 MG PO TB24
30.0000 mg | ORAL_TABLET | Freq: Every day | ORAL | Status: DC
  Administered 2021-01-20 – 2021-01-22 (×3): 30 mg via ORAL
  Filled 2021-01-19 (×3): qty 1

## 2021-01-19 MED ORDER — TETANUS-DIPHTH-ACELL PERTUSSIS 5-2.5-18.5 LF-MCG/0.5 IM SUSY: 0.5000 mL | PREFILLED_SYRINGE | Freq: Once | INTRAMUSCULAR | Status: DC

## 2021-01-19 MED ORDER — MENTHOL 3 MG MT LOZG: 1.0000 | LOZENGE | OROMUCOSAL | Status: DC | PRN

## 2021-01-19 MED ORDER — PHENYLEPHRINE HCL-NACL 20-0.9 MG/250ML-% IV SOLN
INTRAVENOUS | Status: DC | PRN
  Administered 2021-01-19: 30 ug/min via INTRAVENOUS

## 2021-01-19 MED ORDER — COCONUT OIL OIL: 1.0000 "application " | TOPICAL_OIL | Status: DC | PRN

## 2021-01-19 MED ORDER — NALBUPHINE HCL 10 MG/ML IJ SOLN: 5.0000 mg | Freq: Once | INTRAMUSCULAR | Status: AC | PRN

## 2021-01-19 MED ORDER — FENTANYL CITRATE (PF) 100 MCG/2ML IJ SOLN: 25.0000 ug | INTRAMUSCULAR | Status: DC | PRN

## 2021-01-19 MED ORDER — KETOROLAC TROMETHAMINE 30 MG/ML IJ SOLN: 30.0000 mg | Freq: Four times a day (QID) | INTRAMUSCULAR | Status: DC | PRN

## 2021-01-19 MED ORDER — OXYTOCIN-SODIUM CHLORIDE 30-0.9 UT/500ML-% IV SOLN
2.5000 [IU]/h | INTRAVENOUS | Status: AC
  Administered 2021-01-19: 2.5 [IU]/h via INTRAVENOUS
  Filled 2021-01-19: qty 500

## 2021-01-19 MED ORDER — NALBUPHINE HCL 10 MG/ML IJ SOLN: 5.0000 mg | INTRAMUSCULAR | Status: DC | PRN

## 2021-01-19 MED ORDER — NALBUPHINE HCL 10 MG/ML IJ SOLN
5.0000 mg | INTRAMUSCULAR | Status: DC | PRN
Start: 2021-01-19 — End: 2021-01-22

## 2021-01-19 MED ORDER — SODIUM CHLORIDE 0.9 % IR SOLN
Status: DC | PRN
  Administered 2021-01-19: 1

## 2021-01-19 MED ORDER — LACTATED RINGERS IV SOLN: INTRAVENOUS | Status: DC

## 2021-01-19 MED ORDER — LACTATED RINGERS IV SOLN: INTRAVENOUS | Status: DC | PRN

## 2021-01-19 MED ORDER — ACETAMINOPHEN 500 MG PO TABS
1000.0000 mg | ORAL_TABLET | Freq: Once | ORAL | Status: AC
  Administered 2021-01-19: 1000 mg via ORAL

## 2021-01-19 MED ORDER — NALOXONE HCL 4 MG/10ML IJ SOLN
1.0000 ug/kg/h | INTRAVENOUS | Status: DC | PRN
  Filled 2021-01-19: qty 5

## 2021-01-19 MED ORDER — SENNOSIDES-DOCUSATE SODIUM 8.6-50 MG PO TABS
2.0000 | ORAL_TABLET | ORAL | Status: DC
  Administered 2021-01-19 – 2021-01-21 (×2): 2 via ORAL
  Filled 2021-01-19 (×2): qty 2

## 2021-01-19 MED ORDER — ZOLPIDEM TARTRATE 5 MG PO TABS: 5.0000 mg | ORAL_TABLET | Freq: Every evening | ORAL | Status: DC | PRN

## 2021-01-19 MED ORDER — KETOROLAC TROMETHAMINE 30 MG/ML IJ SOLN
INTRAMUSCULAR | Status: AC
  Filled 2021-01-19: qty 1

## 2021-01-19 MED ORDER — SODIUM CHLORIDE 0.9% FLUSH: 3.0000 mL | INTRAVENOUS | Status: DC | PRN

## 2021-01-19 MED ORDER — DEXAMETHASONE SODIUM PHOSPHATE 10 MG/ML IJ SOLN
INTRAMUSCULAR | Status: DC | PRN
  Administered 2021-01-19: 10 mg via INTRAVENOUS

## 2021-01-19 MED ORDER — KETOROLAC TROMETHAMINE 30 MG/ML IJ SOLN
INTRAMUSCULAR | Status: DC | PRN
  Administered 2021-01-19: 30 mg via INTRAVENOUS

## 2021-01-19 MED ORDER — NALBUPHINE HCL 10 MG/ML IJ SOLN
5.0000 mg | Freq: Once | INTRAMUSCULAR | Status: AC | PRN
Start: 2021-01-19 — End: 2021-01-19
  Administered 2021-01-19: 5 mg via INTRAVENOUS

## 2021-01-19 MED ORDER — ONDANSETRON HCL 4 MG/2ML IJ SOLN: 4.0000 mg | Freq: Three times a day (TID) | INTRAMUSCULAR | Status: DC | PRN

## 2021-01-19 MED ORDER — STERILE WATER FOR IRRIGATION IR SOLN
Status: DC | PRN
  Administered 2021-01-19: 1000 mL

## 2021-01-19 MED ORDER — DIPHENHYDRAMINE HCL 25 MG PO CAPS
25.0000 mg | ORAL_CAPSULE | Freq: Four times a day (QID) | ORAL | Status: DC | PRN
  Administered 2021-01-19 – 2021-01-20 (×2): 25 mg via ORAL

## 2021-01-19 MED ORDER — ACETAMINOPHEN 500 MG PO TABS
ORAL_TABLET | ORAL | Status: AC
  Filled 2021-01-19: qty 2

## 2021-01-19 MED ORDER — NIFEDIPINE ER OSMOTIC RELEASE 30 MG PO TB24: 30.0000 mg | ORAL_TABLET | Freq: Every day | ORAL | Status: DC

## 2021-01-19 MED ORDER — FENTANYL CITRATE (PF) 100 MCG/2ML IJ SOLN
INTRAMUSCULAR | Status: DC | PRN
  Administered 2021-01-19: 85 ug via INTRAVENOUS

## 2021-01-19 MED ORDER — PRENATAL MULTIVITAMIN CH
1.0000 | ORAL_TABLET | Freq: Every day | ORAL | Status: DC
  Administered 2021-01-19 – 2021-01-22 (×4): 1 via ORAL
  Filled 2021-01-19 (×4): qty 1

## 2021-01-19 MED ORDER — KETOROLAC TROMETHAMINE 30 MG/ML IJ SOLN
30.0000 mg | Freq: Four times a day (QID) | INTRAMUSCULAR | Status: DC | PRN
  Filled 2021-01-19: qty 1

## 2021-01-19 MED ORDER — FENTANYL CITRATE (PF) 100 MCG/2ML IJ SOLN
INTRAMUSCULAR | Status: DC | PRN
  Administered 2021-01-19: 15 ug via INTRATHECAL

## 2021-01-19 MED ORDER — SIMETHICONE 80 MG PO CHEW
80.0000 mg | CHEWABLE_TABLET | ORAL | Status: DC | PRN
  Filled 2021-01-19: qty 1

## 2021-01-19 MED ORDER — KETOROLAC TROMETHAMINE 30 MG/ML IJ SOLN
30.0000 mg | Freq: Four times a day (QID) | INTRAMUSCULAR | Status: AC
  Administered 2021-01-19 – 2021-01-20 (×4): 30 mg via INTRAVENOUS
  Filled 2021-01-19 (×4): qty 1

## 2021-01-19 MED ORDER — ACETAMINOPHEN 500 MG PO TABS: 1000.0000 mg | ORAL_TABLET | Freq: Four times a day (QID) | ORAL | Status: DC

## 2021-01-19 MED ORDER — WITCH HAZEL-GLYCERIN EX PADS: 1.0000 "application " | MEDICATED_PAD | CUTANEOUS | Status: DC | PRN

## 2021-01-19 MED ORDER — OXYCODONE HCL 5 MG PO TABS
5.0000 mg | ORAL_TABLET | ORAL | Status: DC | PRN
  Administered 2021-01-19: 5 mg via ORAL
  Administered 2021-01-21: 10 mg via ORAL
  Filled 2021-01-19: qty 1
  Filled 2021-01-19: qty 2

## 2021-01-19 MED ORDER — NALOXONE HCL 0.4 MG/ML IJ SOLN: 0.4000 mg | INTRAMUSCULAR | Status: DC | PRN

## 2021-01-19 MED ORDER — PROMETHAZINE HCL 25 MG/ML IJ SOLN: 6.2500 mg | INTRAMUSCULAR | Status: DC | PRN

## 2021-01-19 MED ORDER — OXYTOCIN-SODIUM CHLORIDE 30-0.9 UT/500ML-% IV SOLN
INTRAVENOUS | Status: DC | PRN
  Administered 2021-01-19: 30 [IU] via INTRAVENOUS

## 2021-01-19 MED ORDER — KETOROLAC TROMETHAMINE 30 MG/ML IJ SOLN
30.0000 mg | Freq: Once | INTRAMUSCULAR | Status: AC
  Administered 2021-01-19: 30 mg via INTRAVENOUS

## 2021-01-19 MED ORDER — CEFAZOLIN SODIUM-DEXTROSE 2-4 GM/100ML-% IV SOLN
2.0000 g | INTRAVENOUS | Status: AC
  Administered 2021-01-19: 2 g via INTRAVENOUS
  Filled 2021-01-19: qty 100

## 2021-01-19 MED ORDER — BUPIVACAINE IN DEXTROSE 0.75-8.25 % IT SOLN
INTRATHECAL | Status: DC | PRN
  Administered 2021-01-19: 1.6 mL via INTRATHECAL

## 2021-01-19 MED ORDER — DIBUCAINE (PERIANAL) 1 % EX OINT: 1.0000 "application " | TOPICAL_OINTMENT | CUTANEOUS | Status: DC | PRN

## 2021-01-19 MED ORDER — PHENYLEPHRINE 40 MCG/ML (10ML) SYRINGE FOR IV PUSH (FOR BLOOD PRESSURE SUPPORT)
PREFILLED_SYRINGE | INTRAVENOUS | Status: DC | PRN
  Administered 2021-01-19 (×3): 120 ug via INTRAVENOUS
  Administered 2021-01-19: 180 ug via INTRAVENOUS

## 2021-01-19 MED ORDER — DIPHENHYDRAMINE HCL 50 MG/ML IJ SOLN: 12.5000 mg | Freq: Four times a day (QID) | INTRAMUSCULAR | Status: DC | PRN

## 2021-01-19 MED ORDER — NALBUPHINE HCL 10 MG/ML IJ SOLN
INTRAMUSCULAR | Status: AC
  Filled 2021-01-19: qty 1

## 2021-01-19 MED ORDER — MORPHINE SULFATE (PF) 0.5 MG/ML IJ SOLN
INTRAMUSCULAR | Status: DC | PRN
  Administered 2021-01-19: .15 mg via INTRATHECAL

## 2021-01-19 MED ORDER — SOD CITRATE-CITRIC ACID 500-334 MG/5ML PO SOLN
30.0000 mL | Freq: Once | ORAL | Status: AC
  Administered 2021-01-19: 30 mL via ORAL
  Filled 2021-01-19: qty 15

## 2021-01-19 MED ORDER — SIMETHICONE 80 MG PO CHEW
80.0000 mg | CHEWABLE_TABLET | Freq: Three times a day (TID) | ORAL | Status: DC
  Administered 2021-01-19 – 2021-01-22 (×7): 80 mg via ORAL
  Filled 2021-01-19 (×9): qty 1

## 2021-01-19 MED ORDER — DIPHENHYDRAMINE HCL 25 MG PO CAPS
25.0000 mg | ORAL_CAPSULE | Freq: Four times a day (QID) | ORAL | Status: DC | PRN
  Filled 2021-01-19 (×2): qty 1

## 2021-01-19 MED ORDER — ACETAMINOPHEN 500 MG PO TABS
1000.0000 mg | ORAL_TABLET | Freq: Four times a day (QID) | ORAL | Status: DC
  Administered 2021-01-19 – 2021-01-22 (×10): 1000 mg via ORAL
  Filled 2021-01-19 (×12): qty 2

## 2021-01-19 MED ORDER — ENOXAPARIN SODIUM 60 MG/0.6ML IJ SOSY
0.5000 mg/kg | PREFILLED_SYRINGE | INTRAMUSCULAR | Status: DC
  Administered 2021-01-20 – 2021-01-21 (×2): 50 mg via SUBCUTANEOUS
  Filled 2021-01-19 (×3): qty 0.6

## 2021-01-19 MED ORDER — IBUPROFEN 600 MG PO TABS
600.0000 mg | ORAL_TABLET | Freq: Four times a day (QID) | ORAL | Status: DC
  Administered 2021-01-20 – 2021-01-22 (×7): 600 mg via ORAL
  Filled 2021-01-19 (×8): qty 1

## 2021-01-19 MED ORDER — ONDANSETRON HCL 4 MG/2ML IJ SOLN
INTRAMUSCULAR | Status: DC | PRN
  Administered 2021-01-19: 4 mg via INTRAVENOUS

## 2021-01-19 MED ORDER — FUROSEMIDE 10 MG/ML IJ SOLN
INTRAMUSCULAR | Status: DC | PRN
  Administered 2021-01-19: 10 mg via INTRAMUSCULAR

## 2021-01-19 SURGICAL SUPPLY — 38 items
BENZOIN TINCTURE PRP APPL 2/3 (GAUZE/BANDAGES/DRESSINGS) ×2 IMPLANT
CHLORAPREP W/TINT 26ML (MISCELLANEOUS) ×2 IMPLANT
CLAMP CORD UMBIL (MISCELLANEOUS) IMPLANT
CLOSURE STERI STRIP 1/2 X4 (GAUZE/BANDAGES/DRESSINGS) ×2 IMPLANT
CLOTH BEACON ORANGE TIMEOUT ST (SAFETY) ×2 IMPLANT
DRAPE C SECTION CLR SCREEN (DRAPES) IMPLANT
DRSG OPSITE POSTOP 4X10 (GAUZE/BANDAGES/DRESSINGS) ×2 IMPLANT
ELECT REM PT RETURN 9FT ADLT (ELECTROSURGICAL) ×2
ELECTRODE REM PT RTRN 9FT ADLT (ELECTROSURGICAL) ×1 IMPLANT
EXTRACTOR VACUUM M CUP 4 TUBE (SUCTIONS) IMPLANT
GLOVE BIO SURGEON STRL SZ7.5 (GLOVE) ×2 IMPLANT
GLOVE BIOGEL PI IND STRL 7.0 (GLOVE) ×1 IMPLANT
GLOVE BIOGEL PI INDICATOR 7.0 (GLOVE) ×1
GOWN STRL REUS W/TWL 2XL LVL3 (GOWN DISPOSABLE) ×2 IMPLANT
GOWN STRL REUS W/TWL LRG LVL3 (GOWN DISPOSABLE) ×4 IMPLANT
KIT ABG SYR 3ML LUER SLIP (SYRINGE) IMPLANT
NEEDLE HYPO 22GX1.5 SAFETY (NEEDLE) ×2 IMPLANT
NEEDLE HYPO 25X5/8 SAFETYGLIDE (NEEDLE) IMPLANT
NS IRRIG 1000ML POUR BTL (IV SOLUTION) ×2 IMPLANT
PACK C SECTION WH (CUSTOM PROCEDURE TRAY) ×2 IMPLANT
PAD OB MATERNITY 4.3X12.25 (PERSONAL CARE ITEMS) ×2 IMPLANT
PENCIL SMOKE EVAC W/HOLSTER (ELECTROSURGICAL) ×2 IMPLANT
RTRCTR C-SECT PINK 25CM LRG (MISCELLANEOUS) ×2 IMPLANT
STRIP CLOSURE SKIN 1/2X4 (GAUZE/BANDAGES/DRESSINGS) ×2 IMPLANT
SUT CHROMIC 1 CTX 36 (SUTURE) ×4 IMPLANT
SUT VIC AB 1 CT1 36 (SUTURE) ×4 IMPLANT
SUT VIC AB 2-0 CT1 (SUTURE) ×2 IMPLANT
SUT VIC AB 2-0 CT1 27 (SUTURE) ×2
SUT VIC AB 2-0 CT1 TAPERPNT 27 (SUTURE) ×1 IMPLANT
SUT VIC AB 3-0 CT1 27 (SUTURE) ×4
SUT VIC AB 3-0 CT1 TAPERPNT 27 (SUTURE) ×2 IMPLANT
SUT VIC AB 3-0 SH 27 (SUTURE)
SUT VIC AB 3-0 SH 27X BRD (SUTURE) IMPLANT
SUT VIC AB 4-0 KS 27 (SUTURE) ×2 IMPLANT
SYR BULB IRRIGATION 50ML (SYRINGE) IMPLANT
TOWEL OR 17X24 6PK STRL BLUE (TOWEL DISPOSABLE) ×2 IMPLANT
TRAY FOLEY W/BAG SLVR 14FR LF (SET/KITS/TRAYS/PACK) ×2 IMPLANT
WATER STERILE IRR 1000ML POUR (IV SOLUTION) ×2 IMPLANT

## 2021-01-19 NOTE — Progress Notes (Signed)
Pt called out to express concern with bleeding. Patient had small amount of blood on pad with slight trickle with uterine massage that resolved. RN noted small amount of bleeding on Honeycomb dressing outside of marked old blood parameters. Orders obtained from Dr. Rip Harbour at 1557 to apply pressure dressing. I attempted to ambulate patient per patient request at 1810. Patient stated feeling dizzy upon standing and orthostatic blood pressure dropped from 152/105 to 123/94. Patient returned to lying position, Foley catheter still in place.

## 2021-01-19 NOTE — Progress Notes (Signed)
Patient ID: ABIGAYL HOR, adult   DOB: 02-05-2001, 20 y.o.   MRN: [id number] [place] NOTE  DANELL VAZQUEZ is a 20 y.o. G1P0 at [redacted]w[redacted]d  who is admitted for Mono/Di twin gestation with IUGR and PEC without severe features.   Fetal presentation is unsure. Length of Stay:  4  Days  Subjective: Pt without complaints this morning. Denies HA or vision changes Patient reports good fetal movement.  She reports no uterine contractions, no bleeding and no loss of fluid per vagina.  Vitals:  Blood pressure 127/74, pulse 82, temperature 98.6 F (37 C), temperature source Oral, resp. rate 18, height 5\' 1"  (1.549 m), weight 100.2 kg, last menstrual period 05/11/2020, SpO2 99 %.   Physical Examination: Lungs clear Heart RRR Abd soft + BS gravid Ext non tender    Labs:  Results for orders placed or performed during the hospital encounter of 01/14/21 (from the past 24 hour(s))  CBC   Collection Time: 01/19/21  5:22 AM  Result Value Ref Range   WBC 6.6 4.0 - 10.5 K/uL   RBC 4.36 3.87 - 5.11 MIL/uL   Hemoglobin 11.2 (L) 12.0 - 15.0 g/dL   HCT 34.5 (L) 36.0 - 46.0 %   MCV 79.1 (L) 80.0 - 100.0 fL   MCH 25.7 (L) 26.0 - 34.0 pg   MCHC 32.5 30.0 - 36.0 g/dL   RDW 14.0 11.5 - 15.5 %   Platelets 237 150 - 400 K/uL   nRBC 0.0 0.0 - 0.2 %  Comprehensive metabolic panel   Collection Time: 01/19/21  5:22 AM  Result Value Ref Range   Sodium 138 135 - 145 mmol/L   Potassium 4.2 3.5 - 5.1 mmol/L   Chloride 107 98 - 111 mmol/L   CO2 25 22 - 32 mmol/L   Glucose, Bld 80 70 - 99 mg/dL   BUN <5 (L) 6 - 20 mg/dL   Creatinine, Ser 0.66 0.44 - 1.00 mg/dL   Calcium 9.2 8.9 - 10.3 mg/dL   Total Protein 5.4 (L) 6.5 - 8.1 g/dL   Albumin 2.5 (L) 3.5 - 5.0 g/dL   AST 16 15 - 41 U/L   ALT 13 0 - 44 U/L   Alkaline Phosphatase 111 38 - 126 U/L   Total Bilirubin 0.4 0.3 - 1.2 mg/dL   GFR, Estimated >60 >60 mL/min   Anion gap 6 5 - 15  Type and screen [place]   Collection Time: 01/19/21  5:22 AM  Result Value Ref Range   ABO/RH(D) O POS    Antibody Screen NEG    Sample Expiration      01/22/2021,2359 Performed at [facility], [address] [place], [place] [address]     Imaging Studies:    NA    Medications:  Scheduled . aspirin EC  81 mg Oral Daily  . docusate sodium  100 mg Oral Daily  . doxylamine (Sleep)  25 mg Oral QHS  . NIFEdipine  30 mg Oral Daily  . prenatal multivitamin  1 tablet Oral Q1200  . sodium citrate-citric acid  30 mL Oral Once  . valACYclovir  1,000 mg Oral Daily  . vitamin B-6  25 mg Oral BID   I have reviewed the patient's current medications.  ASSESSMENT: IUP 34 0/7 Mono/Di twins PEC without severe features   PLAN: The risks of cesarean section discussed with the patient included but were not limited to: bleeding which may  require transfusion or reoperation; infection which may require antibiotics; injury to bowel, bladder, ureters or other surrounding organs; injury to the fetus; need for additional procedures including hysterectomy in the event of a life-threatening hemorrhage; placental abnormalities wth subsequent pregnancies, incisional problems, thromboembolic phenomenon and other postoperative/anesthesia complications. The patient concurred with the proposed plan, giving informed written consent for the procedure.   Anesthesia and OR aware. Preoperative prophylactic antibiotics and SCDs ordered on call to the OR.  To OR when ready.       Chancy Milroy 01/19/2021,9:40 AM

## 2021-01-19 NOTE — Discharge Instructions (Signed)
Cesarean Delivery, Care After This sheet gives you information about how to care for yourself after your procedure. Your health care provider may also give you more specific instructions. If you have problems or questions, contact your health care provider. What can I expect after the procedure? After the procedure, it is common to have:  A small amount of blood or clear fluid coming from the incision.  Some redness, swelling, and pain in your incision area.  Some abdominal pain and soreness.  Vaginal bleeding (lochia). Even though you did not have a vaginal delivery, you will still have vaginal bleeding and discharge.  Pelvic cramps.  Fatigue. You may have pain, swelling, and discomfort in the tissue between your vagina and your anus (perineum) if:  Your C-section was unplanned, and you were allowed to labor and push.  An incision was made in the area (episiotomy) or the tissue tore during attempted vaginal delivery. Follow these instructions at home: Incision care  Follow instructions from your health care provider about how to take care of your incision. Make sure you: ? Wash your hands with soap and water before you change your bandage (dressing). If soap and water are not available, use hand sanitizer. ? If you have a dressing, change it or remove it as told by your health care provider. ? Leave stitches (sutures), skin staples, skin glue, or adhesive strips in place. These skin closures may need to stay in place for 2 weeks or longer. If adhesive strip edges start to loosen and curl up, you may trim the loose edges. Do not remove adhesive strips completely unless your health care provider tells you to do that.  Check your incision area every day for signs of infection. Check for: ? More redness, swelling, or pain. ? More fluid or blood. ? Warmth. ? Pus or a bad smell.  Do not take baths, swim, or use a hot tub until your health care provider says it's okay. Ask your health  care provider if you can take showers.  When you cough or sneeze, hug a pillow. This helps with pain and decreases the chance of your incision opening up (dehiscing). Do this until your incision heals.   Medicines  Take over-the-counter and prescription medicines only as told by your health care provider.  If you were prescribed an antibiotic medicine, take it as told by your health care provider. Do not stop taking the antibiotic even if you start to feel better.  Do not drive or use heavy machinery while taking prescription pain medicine. Lifestyle  Do not drink alcohol. This is especially important if you are breastfeeding or taking pain medicine.  Do not use any products that contain nicotine or tobacco, such as cigarettes, e-cigarettes, and chewing tobacco. If you need help quitting, ask your health care provider. Eating and drinking  Drink at least 8 eight-ounce glasses of water every day unless told not to by your health care provider. If you breastfeed, you may need to drink even more water.  Eat high-fiber foods every day. These foods may help prevent or relieve constipation. High-fiber foods include: ? Whole grain cereals and breads. ? Brown rice. ? Beans. ? Fresh fruits and vegetables. Activity  If possible, have someone help you care for your baby and help with household activities for at least a few days after you leave the hospital.  Return to your normal activities as told by your health care provider. Ask your health care provider what activities are safe for   you.  Rest as much as possible. Try to rest or take a nap while your baby is sleeping.  Do not lift anything that is heavier than 10 lbs (4.5 kg), or the limit that you were told, until your health care provider says that it is safe.  Talk with your health care provider about when you can engage in sexual activity. This may depend on your: ? Risk of infection. ? How fast you heal. ? Comfort and desire to  engage in sexual activity.   General instructions  Do not use tampons or douches until your health care provider approves.  Wear loose, comfortable clothing and a supportive and well-fitting bra.  Keep your perineum clean and dry. Wipe from front to back when you use the toilet.  If you pass a blood clot, save it and call your health care provider to discuss. Do not flush blood clots down the toilet before you get instructions from your health care provider.  Keep all follow-up visits for you and your baby as told by your health care provider. This is important. Contact a health care provider if:  You have: ? A fever. ? Bad-smelling vaginal discharge. ? Pus or a bad smell coming from your incision. ? Difficulty or pain when urinating. ? A sudden increase or decrease in the frequency of your bowel movements. ? More redness, swelling, or pain around your incision. ? More fluid or blood coming from your incision. ? A rash. ? Nausea. ? Little or no interest in activities you used to enjoy. ? Questions about caring for yourself or your baby.  Your incision feels warm to the touch.  Your breasts turn red or become painful or hard.  You feel unusually sad or worried.  You vomit.  You pass a blood clot from your vagina.  You urinate more than usual.  You are dizzy or light-headed. Get help right away if:  You have: ? Pain that does not go away or get better with medicine. ? Chest pain. ? Difficulty breathing. ? Blurred vision or spots in your vision. ? Thoughts about hurting yourself or your baby. ? New pain in your abdomen or in one of your legs. ? A severe headache.  You faint.  You bleed from your vagina so much that you fill more than one sanitary pad in one hour. Bleeding should not be heavier than your heaviest period. Summary  After the procedure, it is common to have pain at your incision site, abdominal cramping, and slight bleeding from your vagina.  Check  your incision area every day for signs of infection.  Tell your health care provider about any unusual symptoms.  Keep all follow-up visits for you and your baby as told by your health care provider. This information is not intended to replace advice given to you by your health care provider. Make sure you discuss any questions you have with your health care provider. Document Revised: 02/23/2018 Document Reviewed: 02/23/2018 Elsevier Patient Education  2021 Elsevier Inc.  

## 2021-01-19 NOTE — Transfer of Care (Signed)
Immediate Anesthesia Transfer of Care Note  Patient: Wilkie Aye  Procedure(s) Performed: CESAREAN SECTION MULTI-GESTATIONAL (N/A )  Patient Location: PACU  Anesthesia Type:Spinal  Level of Consciousness: awake, alert  and oriented  Airway & Oxygen Therapy: Patient Spontanous Breathing and Patient connected to nasal cannula oxygen  Post-op Assessment: Report given to RN and Post -op Vital signs reviewed and stable  Post vital signs: Reviewed and stable  Last Vitals:  Vitals Value Taken Time  BP    Temp    Pulse 77 01/19/21 1156  Resp 14 01/19/21 1156  SpO2 98 % 01/19/21 1156  Vitals shown include unvalidated device data.  Last Pain:  Vitals:   01/19/21 1006  TempSrc: Oral  PainSc:       Patients Stated Pain Goal: 0 (08/67/61 9509)  Complications: No complications documented.

## 2021-01-19 NOTE — Anesthesia Postprocedure Evaluation (Addendum)
Anesthesia Post Note  Patient: Lacey [name]  Procedure(s) Performed: CESAREAN SECTION MULTI-GESTATIONAL (N/A )     Patient location during evaluation: PACU Anesthesia Type: Spinal Level of consciousness: awake and alert and oriented Pain management: pain level controlled Vital Signs Assessment: post-procedure vital signs reviewed and stable Respiratory status: spontaneous breathing, nonlabored ventilation and respiratory function stable Cardiovascular status: blood pressure returned to baseline Postop Assessment: no apparent nausea or vomiting, spinal receding, no headache and no backache Anesthetic complications: no   No complications documented.      Brennan Bailey

## 2021-01-19 NOTE — Anesthesia Procedure Notes (Signed)
Spinal  Patient location during procedure: OR Start time: 01/19/2021 10:40 AM End time: 01/19/2021 10:47 AM Reason for block: surgical anesthesia Staffing Performed: anesthesiologist  Anesthesiologist: Brennan Bailey, MD Preanesthetic Checklist Completed: patient identified, IV checked, risks and benefits discussed, monitors and equipment checked, pre-op evaluation and timeout performed Spinal Block Patient position: sitting Prep: DuraPrep and site prepped and draped Patient monitoring: heart rate, continuous pulse ox and blood pressure Approach: midline Location: L3-4 Injection technique: single-shot Needle Needle type: Pencan  Needle gauge: 24 G Needle length: 10 cm Assessment Events: high spinal and CSF return Additional Notes Risks, benefits, and alternative discussed. Patient gave consent to procedure. Prepped and draped in sitting position. Clear CSF obtained after one needle pass, however unable to aspirate CSF. Re-approached at same level and unable to aspirate CSF again. New attempt made at L2-3 with clear CSF obtained, positive terminal aspiration. No pain or paraesthesias with injection.  Patient placed in supine position with left lateral tilt. Sensory level checked at 3 minutes and no sensation to skin prick at cervical dermatomes. Patient placed in reverse trendelenburg position. Shortly thereafter patient c/o difficulty swallowing and weak voice. Minimal hand grip strength. Anesthesia circuit connected and patient taking small tidal volumes in 100s. Assisted respirations with positive pressure for several minutes with patient in steep reverse trendelenburg until able to speak and taking normal tidal volumes without assistance. Surgery completed uneventfully and spinal receding appropriately.    Tawny Asal, MD

## 2021-01-19 NOTE — Lactation Note (Signed)
This note was copied from a baby's chart. Lactation Consultation Note  Patient Name: Lacey [name] Date: 01/19/2021 Reason for consult: Initial assessment;Mother's request;Primapara;1st time breastfeeding;NICU baby;Late-preterm 34-36.6wks;Infant < 6lbs;Multiple gestation;Other (Comment) (PIH) Age:21 hours   LC set Mom up on DEBP pumping q 3 hrs for 15 minutes. RN to provide labels for any EBM to transport to NICU.   Mom questions about yeast, mentioned during her pregnancy. At the time no medication was given, Mom instructed to not sleep in a tight bra.   Mom denied any itching at the time of this visit. Her nipples show no evidence of inflammation. She has some dried skin along the base of the areolae. LC encouraged Mom to have a discussion with OBGYN on next visit and have them examine her breasts.    [place] alerted RN, Jacqualine Mau, Mom concern about yeast in the past and the discussion that I had with the patient to have the provider address her concerns. RN to work on addressing Mother concerns before transporting EBM to NICU.  Mom provided with NICU book.  [facility] brochure of inpatient and outpatient services reviwed.  All questions answered at the end of the visit.   Maternal Data Has patient been taught Hand Expression?: Yes Does the patient have breastfeeding experience prior to this delivery?: No  Feeding    LATCH Score                    Lactation Tools Discussed/Used Tools: Pump;Flanges Flange Size: 24 Breast pump type: Double-Electric Breast Pump Pump Education: Setup, frequency, and cleaning;Milk Storage Reason for Pumping: increase stimulation Pumping frequency: every 3 hrs for 15 minutes  Interventions Interventions: Breast feeding basics reviewed;Support pillows;Education;DEBP;Hand express;Expressed milk  Discharge Pump: Manual WIC Program: Yes  Consult Status Consult Status: Follow-up Date: 01/20/21 Follow-up type:  In-patient    Lacey Koob  [name] 01/19/2021, 7:10 PM

## 2021-01-19 NOTE — Op Note (Signed)
Operative Note   SURGERY DATE: 01/19/2021  PRE-OP DIAGNOSIS:  *Pregnancy at [redacted]w[redacted]d  *Mo-Di Twins, IUGR  *Fetal Malpresentation (Twin A Breech, Twin B Transverse) *PreEclampsia without severe features  POST-OP DIAGNOSIS: same   PROCEDURE: primary low transverse cesarean section via pfannenstiel skin incision with double layer uterine closure  SURGEON: Surgeon(s) and Role:    * Chancy Milroy, MD - Primary    * Demarques Pilz, Placido Sou, MD - Assisting  ANESTHESIA: Spinal  ESTIMATED BLOOD LOSS: 661  DRAINS: 569mL UOP via indwelling foley  TOTAL IV FLUIDS: 1641mL crystalloid  VTE PROPHYLAXIS: SCDs to bilateral lower extremities  ANTIBIOTICS: Two grams of Cefazolin were given., within 1 hour of skin incision  SPECIMENS: placenta to pathology  COMPLICATIONS: none  INDICATIONS: Twin A Breech Presentation   FINDINGS: No intra-abdominal adhesions were noted. Grossly normal uterus, tubes and ovaries. Clear amniotic fluid, Breech Female infant, weight 1491, Apgars 7/9, Breech Female infant, weight 1580, Apgars,9/9  intact placenta.   PROCEDURE IN DETAIL: The patient was taken to the operating room where anesthesia was administered and normal fetal heart tones were confirmed. She was then prepped and draped in the normal fashion in the dorsal supine position with a leftward tilt.  After a time out was performed, a pfannensteil skin incision was made with the scalpel and carried through to the underlying layer of fascia. The fascia was then incised at the midline and this incision was extended laterally with the mayo scissors. Attention was turned to the superior aspect of the fascial incision which was grasped with the kocher clamps x 2, tented up and the rectus muscles were dissected off with the mayo scissors. In a similar fashion the inferior aspect of the fascial incision was grasped with the kocher clamps, tented up and the rectus muscles dissected off with the mayo scissors. The rectus  muscles were then separated in the midline and the peritoneum was entered bluntly. The Alexis retractor was inserted.   A low transverse hysterotomy was made with the scalpel until the endometrial cavity was breached and the amniotic sac ruptured with the Allis clamp, yielding clear amniotic fluid. This incision was extended bluntly and Twin A was delivered from frank breech position in routine fashion. The cord was clamped x 2 and cut, and the infant was handed to the awaiting pediatricians, after delayed cord clamping was done. Twin B's amniotic sac was ruptured with the Allis clamp, yielding clear amniotic fluid. Twin B was delivered from footling breech position using routine maneuvers. The cord was clamped x 2 and cut, and the infant was handed to the awaiting pediatricians, after delayed cord clamping was done  The placenta was then gradually expressed from the uterus and then the uterus was cleared of all clots and debris. The hysterotomy was repaired with a running suture of 1-0 chromic. A second imbricating layer of 1-0 chromic suture was then placed.     The hysterotomy and all operative sites were reinspected and excellent hemostasis was noted after irrigation and suction of the abdomen with warm saline.  The peritoneum was closed with a running stitch of 3-0 Vicryl. The fascia was reapproximated with 0 Vicryl in a simple running fashion bilaterally. The subcutaneous layer was then reapproximated with interrupted sutures of 2-0 plain gut, and the skin was then closed with 4-0 vicryl, in a subcuticular fashion.  The patient  tolerated the procedure well. Sponge, lap, needle, and instrument counts were correct x 2. The patient was transferred to the recovery  room awake, alert and breathing independently in stable condition.   Sharene Skeans, MD [facility] Family Medicine Fellow, [facility] for [facility], [place]

## 2021-01-19 NOTE — Discharge Summary (Signed)
Postpartum Discharge Summary  Date of Service updated 01/22/2021     Patient Name: Lacey [name] DOB: 01/05/2001 MRN: [id number]  Date of admission: 01/14/2021 Delivery date:   Neelah, Mannings [[id number]]  01/19/2021    Tanieka, Pownall [[id number]]  01/19/2021   Delivering provider:    Luevenia, Mcavoy [[id number]]  [facility], JULIA Moranda, [place] [[id number]]  Sharene Skeans M   Date of discharge: 01/22/2021  Admitting diagnosis: Monochorionic diamniotic twin gestation in third trimester [O30.033] Monochorionic diamniotic twin gestation in second trimester [O30.032] Intrauterine pregnancy: [redacted]w[redacted]d    Secondary diagnosis:  Active Problems:   MDD (major depressive disorder), recurrent episode, severe (H[place]   Monochorionic diamniotic twin gestation in second trimester   Monochorionic diamniotic twin gestation in third trimester   Mild preeclampsia  Additional problems: none    Discharge diagnosis: Preterm Pregnancy Delivered and Preeclampsia (mild)                                              Post partum procedures:none Augmentation: N/A Complications: None   Hospital course: Sceduled C/S   20y.o. yo G1P0 at 376w0das admitted to the hospital 01/14/2021 for preeclampsia without severe features, FGR of both twins. She went for scheduled cesarean section with the following indication:FGR of Mo-Di twins.Delivery details are as follows:  Membrane Rupture Time/Date:    DoShandra, Szymborski0[[id number]]11:11 AM    DoFinleigh, Cheonga[place]illiard0[[id number]]11:13 AM  ,   DoAreesha, Dehaven0[[id number]]01/19/2021    DoAparna, Vanderweele0[[id number]]01/19/2021    Delivery Method:   DoKela Millin0[[id number]]C-Section, Low Tr[address]  DoMarzella, Miracle0[[id number]]   Details of operation can be found in separate operative note.  Patient had an uncomplicated postpartum course.  She is ambulating, tolerating a regular diet,  passing flatus, and urinating well. Patient is discharged home in stable condition on  01/22/21        Newborn Data: Birth date:   DoZoi, Devine0[[id number]]01/19/2021    DoJassmin, Kemmerer0[[id number]]01/19/2021   Birth time:   DoKristi, Norment0[[id number]]11:11 AM    DoMeghna, [place]0[[id number]]11:14 AM   Gender:   DoKimberly, Nieland0[[id number]]Female    DoPegge, Cumberledgea[place]ynne[place]0[[id number]]Female   Living status:   DoLalonnie, Shaffer0[[id number]]Living    DoOhanna, GassertaKenefic0[[id number]]Living   Apgars:   DoGreenleigh, Kauth0[[id number]]7 [address][[id number]]  8  ,   PJSRPRX, YVOPF YTWKMQ0[[id number]]9 [address][[id number]]9   Weight:   DoTressa, Maldonado0[[id number]]  6606    DoTove, WidemanaGilby0[[id number]]  1423      Magnesium Sulfate received: No BMZ received: Yes Rhophylac:N/A MMR:N/A T-DaP:Given prenatally Flu: No Transfusion:No  Physical exam  Vitals:   01/21/21 1943 01/21/21 2258 01/22/21 0352 01/22/21 0959  BP: (!) 139/94 (!) 155/90 139/78 137/88  Pulse: 96 (!) 126 88 (!) 138  Resp: _0 Temp: 98.8 F (37.1 C) 98.6 F (37 C) 99 F (37.2 C) 100.1 F (37.8 C)  TempSrc: Oral Oral Oral Oral  SpO2: 98% 99% 100%   Weight:      Height:       General: alert, cooperative and no distress Lochia: appropriate Uterine Fundus: firm Incision: Healing well with no significant drainage, Dressing is clean, dry, and intact DVT Evaluation: No evidence of DVT seen on physical exam. Labs: Lab Results  Component Value Date   WBC 14.8 (H) 01/20/2021   HGB 10.3 (L) 01/20/2021   HCT 30.7 (L) 01/20/2021   MCV 78.3 (L) 01/20/2021   PLT 260 01/20/2021   CMP Latest Ref Rng & Units 01/19/2021  Glucose 70 - 99 mg/dL -  BUN 6 - 20 mg/dL -  Creatinine 0.44 - 1.00 mg/dL 0.65  Sodium 135 - 145 mmol/L -  Potassium 3.5 - 5.1 mmol/L -  Chloride 98 - 111  mmol/L -  CO2 22 - 32 mmol/L -  Calcium 8.9 - 10.3 mg/dL -  Total Protein 6.5 - 8.1 g/dL -  Total Bilirubin 0.3 - 1.2 mg/dL -  Alkaline Phos 38 - 126 U/L -  AST 15 - 41 U/L -  ALT 0 - 44 U/L -   Edinburgh Score: Edinburgh Postnatal Depression Scale Screening Tool 01/21/2021  I have been able to laugh and see the funny side of things. 1  I have looked forward with enjoyment to things. 2  I have blamed myself unnecessarily when things went wrong. 0  I have been anxious or worried for no good reason. 3  I have felt scared or panicky for no good reason. 3  Things have been getting on top of me. 1  I have been so unhappy that I have had difficulty sleeping. 2  I have felt sad or miserable. 1  I have been so unhappy that I have been crying. 2  The thought of harming myself has occurred to me. 0  Edinburgh Postnatal Depression Scale Total 15     After visit meds:  Allergies as of 01/22/2021      Reactions   Fish Allergy Itching   Throat itching   Shellfish Allergy Itching   Throat itching      Medication List    STOP taking these medications   aspirin EC 81 MG tablet   Doxylamine-Pyridoxine 10-10 MG Tbec Commonly known as: Diclegis   ferrous fumarate 325 (106 Fe) MG Tabs tablet Commonly known as: HEMOCYTE - 106 mg FE   valACYclovir 1000 MG tablet Commonly known as: Valtrex     TAKE these medications   Blood Pressure Kit Devi 1 kit by Does not apply route once a week.   calcium carbonate 500 MG chewable tablet Commonly known as: TUMS - dosed in mg elemental calcium Chew 2 tablets by mouth 3 (three) times daily as needed for heartburn.   ibuprofen 600 MG tablet Commonly known as: ADVIL Take 1 tablet (600 mg total) by mouth every 6 (six) hours.   NIFEdipine 30 MG 24 hr tablet Commonly known as: ADALAT CC Take 1 tablet (30 mg total) by mouth daily. Start taking on: Jan 23, 2021   oxyCODONE 5 MG immediate release tablet Commonly known as: Oxy IR/ROXICODONE Take  1-2 tablets (5-10 mg total) by mouth every 4 (four) hours as needed for moderate pain.   PrePLUS 27-1 MG Tabs Take 1 tablet by mouth daily. What changed: when to take this        Discharge home in stable condition Infant Feeding: No evidence of DVT seen on physical exam. Infant Disposition:NICU Discharge instruction: per  After Visit Summary and Postpartum booklet. Activity: Advance as tolerated. Pelvic rest for 6 weeks.  Diet: routine diet Future Appointments: Future Appointments  Date Time Provider [place]  01/28/2021  1:20 PM [place] None  02/17/2021  2:00 PM Woodroe Mode, MD [place] None  03/04/2021  1:00 PM Lynnea Ferrier, [place] None   Follow up Visit:  [place] Follow up in 1 week(s).   Specialty: Obstetrics and Gynecology Why: incision and BP Contact information: [address], [place] [phone]              Please schedule this patient for a In person postpartum visit in 4 weeks with the following provider: MD. Additional Postpartum F/U:Postpartum Depression checkup in six weeks, Incision check 1 week and BP check 1 week  High risk pregnancy complicated by: HTN and Mo-Di twins Delivery mode:     Tanga, [place] [[id number]]  C-Section, Low Transverse    Demetris, Meinhardt [[id number]]     Anticipated Birth Control:  IUD as outpatient    01/22/2021 Emeterio Reeve, MD

## 2021-01-20 ENCOUNTER — Encounter (HOSPITAL_COMMUNITY): Payer: Self-pay | Admitting: Obstetrics & Gynecology

## 2021-01-20 LAB — GLUCOSE, CAPILLARY: Glucose-Capillary: 108 mg/dL — ABNORMAL HIGH (ref 70–99)

## 2021-01-20 LAB — CBC
HCT: 30.7 % — ABNORMAL LOW (ref 36.0–46.0)
Hemoglobin: 10.3 g/dL — ABNORMAL LOW (ref 12.0–15.0)
MCH: 26.3 pg (ref 26.0–34.0)
MCHC: 33.6 g/dL (ref 30.0–36.0)
MCV: 78.3 fL — ABNORMAL LOW (ref 80.0–100.0)
Platelets: 260 10*3/uL (ref 150–400)
RBC: 3.92 MIL/uL (ref 3.87–5.11)
RDW: 14 % (ref 11.5–15.5)
WBC: 14.8 10*3/uL — ABNORMAL HIGH (ref 4.0–10.5)
nRBC: 0.1 % (ref 0.0–0.2)

## 2021-01-20 MED ORDER — HYDROCHLOROTHIAZIDE 25 MG PO TABS
25.0000 mg | ORAL_TABLET | Freq: Every day | ORAL | Status: DC
  Administered 2021-01-20 – 2021-01-22 (×3): 25 mg via ORAL
  Filled 2021-01-20 (×3): qty 1

## 2021-01-20 NOTE — Lactation Note (Signed)
This note was copied from a baby's chart. Lactation Consultation Note  Patient Name: Lacey [name] IPJAS'N Date: 01/20/2021 Reason for consult: Follow-up assessment;NICU baby;1st time breastfeeding;Primapara;Late-preterm 34-36.6wks;Multiple gestation;Infant < 6lbs Age:21 hours    Maternal history GHTN, IUGR, SUD (UDS on infant negative), bipolar disorder   LC in to visit with P2 Mom of twins in NICU.  Mom is about to head to NICU and plans to pump at bedside.  LC noted pump parts disassembled in bin immersed in water.  Talked about washing in hot water, rinsing and air drying in separate bin away from sink.  LC provided a separate bin for drying.  Mom encouraged to collect any colostrum and immediately wash pump parts so they have time to air dry.    Mom will be obtaining a DEBP from [facility] and has already spoken to them regarding an appt on Wednesday.   Encouraged STS with baby and frequent pumping to support a full milk supply.   Lactation Tools Discussed/Used Tools: Pump;Flanges Flange Size: 24 Breast pump type: Double-Electric Breast Pump Pumping frequency: Q 3 hrs Pumped volume: 0 mL (drops)  Interventions Interventions: Breast feeding basics reviewed;Skin to skin;Breast massage;Hand express;DEBP;Education  Consult Status Consult Status: Follow-up Date: 01/21/21 Follow-up type: [place] 01/20/2021, 10:50 AM

## 2021-01-20 NOTE — Progress Notes (Signed)
Subjective: Postpartum Day 1: Cesarean Delivery Patient reports incisional pain and tolerating PO.   Doing well Objective: Vital signs in last 24 hours: Temp:  [97.1 F (36.2 C)-98.8 F (37.1 C)] 98.8 F (37.1 C) (05/23 0831) Pulse Rate:  [63-102] 87 (05/23 0831) Resp:  [11-21] 18 (05/23 0831) BP: (123-156)/(49-105) 145/92 (05/23 0831) SpO2:  [95 %-100 %] 99 % (05/23 0831)  Physical Exam:  General: alert, cooperative and no distress Lochia: appropriate Uterine Fundus: firm Incision: healing well, no significant drainage, no dehiscence DVT Evaluation: No evidence of DVT seen on physical exam.  Recent Labs    01/19/21 1505 01/20/21 0401  HGB 10.7* 10.3*  HCT 33.5* 30.7*    Assessment/Plan: Status post Cesarean section. Doing well postoperatively.  Continue current care. Add HCTZ Florian Buff 01/20/2021, 8:53 AM

## 2021-01-20 NOTE — Plan of Care (Signed)
  Problem: Education: Goal: Knowledge of condition will improve Outcome: Progressing   Problem: Activity: Goal: Will verbalize the importance of balancing activity with adequate rest periods Outcome: Progressing Goal: Ability to tolerate increased activity will improve Outcome: Progressing   Problem: Life Cycle: Goal: Chance of risk for complications during the postpartum period will decrease Outcome: Progressing   Problem: Skin Integrity: Goal: Demonstration of wound healing without infection will improve Outcome: Progressing   

## 2021-01-21 NOTE — Progress Notes (Signed)
CSW acknowledged consult for edinburgh score 15. CSW completed psychosocial assessment today (01/21/21) and provided education regarding the baby blues period vs. perinatal mood disorders, discussed treatment and gave resources for mental health follow up if concerns arise.  CSW recommends self-evaluation during the postpartum time period using the New Mom Checklist from Postpartum Progress and encouraged MOB to contact a medical professional if symptoms are noted at any time. No further intervention needed at this time. CSW will continue to check in with MOB while infants are admitted to the NICU.   Abundio Miu, [place] Worker [facility] Cell#: [phone]

## 2021-01-21 NOTE — Plan of Care (Signed)
  Problem: Clinical Measurements: Goal: Ability to maintain clinical measurements within normal limits will improve Outcome: Progressing Goal: Diagnostic test results will improve Outcome: Progressing   Problem: Pain Managment: Goal: General experience of comfort will improve Outcome: Progressing   Problem: Education: Goal: Knowledge of disease or condition will improve Outcome: Progressing Goal: Knowledge of the prescribed therapeutic regimen will improve Outcome: Progressing   Problem: Education: Goal: Knowledge of disease or condition will improve Outcome: Progressing   Problem: Clinical Measurements: Goal: Complications related to disease process, condition or treatment will be avoided or minimized Outcome: Progressing   Problem: Activity: Goal: Will verbalize the importance of balancing activity with adequate rest periods Outcome: Progressing   Problem: Role Relationship: Goal: Ability to demonstrate positive interaction with newborn will improve Outcome: Progressing

## 2021-01-21 NOTE — Progress Notes (Signed)
Subjective: Postpartum Day 2: Cesarean Delivery Patient reports incisional pain, tolerating PO and no problems voiding.    Objective: Vital signs in last 24 hours: Temp:  [97.8 F (36.6 C)-98.8 F (37.1 C)] 98.1 F (36.7 C) (05/24 0327) Pulse Rate:  [80-107] 80 (05/24 0327) Resp:  [18] 18 (05/24 0327) BP: (127-151)/(71-98) 134/86 (05/24 0327) SpO2:  [99 %-100 %] 100 % (05/24 0327)  Physical Exam:  General: alert, cooperative and no distress Lochia: appropriate Uterine Fundus: firm Incision: healing well, no significant drainage, no dehiscence, no significant erythema DVT Evaluation: No evidence of DVT seen on physical exam.  Recent Labs    01/19/21 1505 01/20/21 0401  HGB 10.7* 10.3*  HCT 33.5* 30.7*    Assessment/Plan: Status post Cesarean section. Doing well postoperatively.  Continue current care. Plan discharge tomorrow Florian Buff 01/21/2021, 7:43 AM

## 2021-01-21 NOTE — Clinical Social Work Maternal (Signed)
CLINICAL SOCIAL WORK MATERNAL/CHILD NOTE  Patient Details  Name: Lacey [name] MRN: [id number] Date of Birth: 15-Feb-2001  Date:  01/21/2021  Clinical Social Worker Initiating Note:  Abundio Miu, [place] Date/Time: Initiated:  01/21/21/1419     Child's Name:  Lacey [name];; Lacey [name]   Biological Parents:  Mother,Father (Father: Martinique Headley)   Need for Interpreter:  None   Reason for Referral:  Parental Support of Premature Babies < 32 weeks/or Critically Ill babies,Current Substance Use/Substance Use During Pregnancy ,Behavioral Health Concerns   Address:  [address] [place] [address]    Phone number:  [phone] (home)     Additional phone number:   Household Members/Support Persons (HM/SP):   Household Member/Support Person 1,Household Member/Support Person 2,Household Member/Support Person 3,Household Member/Support Person 4   HM/SP Name Relationship DOB or Age  HM/SP -36 [name] Customer service manager mom    HM/SP -2   dad    HM/SP -3   sister    HM/SP -4   sister    HM/SP -5        HM/SP -6        HM/SP -7        HM/SP -8          Natural Supports (not living in the home):      Professional Supports: None   Employment:   Unemployed  Type of Work:     Education:  9 to 11 years (9th Grade)   Homebound arranged: No  Financial Resources:  Medicaid   Other Resources:  [employer] Corporate treasurer Considerations Which May Impact Care:    Strengths:  Understanding of illness,Ability to meet basic needs    Psychotropic Medications:         Pediatrician:       Pediatrician List:   [place]      Pediatrician Fax Number:    Risk Factors/Current Problems:  Substance Use ,Mental Health Concerns    Cognitive State:  Able to Concentrate ,Alert ,Goal Oriented ,Linear Thinking    Mood/Affect:  Calm ,Interested  ,Comfortable    CSW Assessment: CSW met with MOB at infants bedside to discuss infants NICU admission, behavioral health concerns and substance use during pregnancy. CSW introduced self and explained reason for consult. MOB was welcoming, pleasant and remained engaged during assessment. MOB reported that she resides with her mom, dad and 2 sisters. MOB reported that she receives both [facility] and food stamps. MOB reported that she has got a little bit of stuff for infants including 2 car seats and 2 basinets. CSW informed MOB about [facility] [employer] [facility]'s [employer] if any assistance is needed obtaining items for infant. MOB reported that assistance with diapers, wipes, clothes and blankets would be helpful. CSW agreed to make referral, MOB agreeable. CSW inquired about MOB's support system, MOB reported that her mom is a support.   CSW inquired about MOB's mental health history. MOB reported that she was diagnosed with anxiety and depression around age 40/14. MOB reported that she feels her depression/anxiety diagnoses were situational surrounding the loss of her sister. MOB reported that she did lots of therapy, noting it helped a little. CSW offered condolences for MOB's loss. MOB shared that she was suicidal as a kid and in and out of behavioral health, MOB attributed this to grieving. MOB reported  that the she didn't recall the Bipolar diagnosis and reported that she feels it is inaccurate and she was just acting out as a teenager. CSW inquired about how MOB was feeling emotionally since giving birth, MOB reported that she was feeling sad and shared that she has been waking up crying. MOB reported that she has been feeling depressed because she misses her babies. CSW acknowledged and validated MOB's feelings. CSW discussed emotions/feelings associated with infant's NICU admission. CSW inquired about MOB's coping skills, MOB initially reported none then later identified her faith and prayer as a coping  skill. CSW inquired about MOB's interest in [place] services, MOB reported that she was interested. CSW agreed to notify [place] of MOB's request. MOB presented calm and did not demonstrate any acute mental health signs/symptoms. CSW assessed for safety, MOB denied SI, HI and domestic violence.   CSW provided education regarding the baby blues period vs. perinatal mood disorders, discussed treatment and gave resources for mental health follow up if concerns arise.  CSW recommends self-evaluation during the postpartum time period using the New Mom Checklist from Postpartum Progress and encouraged MOB to contact a medical professional if symptoms are noted at any time.    CSW provided review of Sudden Infant Death Syndrome (SIDS) precautions.    CSW and MOB discussed infant's NICU admission. CSW informed MOB about the NICU, what to expect and resources/supports available while infant is admitted to the NICU. MOB reported that after speaking with the nurse yesterday she feels well informed about infants care. MOB denied any transportation barriers with visiting infants in the NICU. MOB denied any questions/concerns regarding the NICU. CSW informed MOB about the hospital drug screen policy due to substance use during pregnancy. MOB confirmed that she used marijuana during pregnancy and reported last use at 29 weeks. MOB denied any additional substance use during pregnancy. CSW informed MOB that infants UDS were negative and CDS would continue to be monitored and a CPS report would be made if warranted. MOB verbalized understanding and denied any questions. CSW inquired about any additional stressors or housing concerns, MOB denied any additional stressors or housing concerns.   CSW will continue to offer resources/supports while infants are admitted to the NICU.   CSW will make referral to FSN for requested items.  CSW will notify [facility] Fill that MOB is interested in [place] services.   CSW Plan/Description:   Sudden Infant Death Syndrome (SIDS) Education,Perinatal Mood and Anxiety Disorder (PMADs) Education,Hospital Drug Screen Policy Information,CSW Will Continue to Monitor Umbilical Cord Tissue Drug Screen Results and Make Report if Warranted,Other Patient/Family Education,Other Information/Referral to [employer] and Ongoing Assessment of [place], LCSW 01/21/2021, 2:29 PM

## 2021-01-22 ENCOUNTER — Telehealth: Payer: Self-pay | Admitting: *Deleted

## 2021-01-22 LAB — SURGICAL PATHOLOGY

## 2021-01-22 MED ORDER — OXYCODONE HCL 5 MG PO TABS: 5.0000 mg | ORAL_TABLET | ORAL | 0 refills | Status: DC | PRN

## 2021-01-22 MED ORDER — IBUPROFEN 600 MG PO TABS: 600.0000 mg | ORAL_TABLET | Freq: Four times a day (QID) | ORAL | 0 refills | Status: DC

## 2021-01-22 MED ORDER — NIFEDIPINE ER 30 MG PO TB24: 30.0000 mg | ORAL_TABLET | Freq: Every day | ORAL | 1 refills | Status: AC

## 2021-01-22 NOTE — Lactation Note (Signed)
This note was copied from a baby's chart. Lactation Consultation Note LC to mother's room for f/u visit. She will d/c today. Mother is pumping infrequently and denies breast changes at this time. Will plan f/u in NICU.  Patient Name: Lacey [name] [id number] Date: 01/22/2021 Reason for consult: NICU baby;Follow-up assessment;Multiple gestation Age:21 hours   Feeding Mother's Current Feeding Choice: Breast Milk and Donor Milk   Interventions Interventions: Education;Breast feeding basics reviewed;DEBP  Discharge Discharge Education: Engorgement and breast care  Consult Status Consult Status: Follow-up Follow-up type: [place], [place] IBCLC 01/22/2021, 9:50 AM

## 2021-01-22 NOTE — Progress Notes (Signed)
CSW notified [facility] Fill of MOB's request for services.   CSW made FSN referral for requested items.   Abundio Miu, [place] Worker [facility] Cell#: [phone]

## 2021-01-22 NOTE — Telephone Encounter (Signed)
Lacey [name] called this afternoon and left a message she is having aproblem getting her pain med  And is in severe pain. Per chart had c/s 01/19/21 and discharged today. Has oxycodone ordered.  I called [name] and confirmed she attempted to pick her oxycodone after discharge and they told her problem and she isn't sure but thought it was related to prior auth.  I called [facility] and they originally said it was a prior authorization issue but reran the prescription and stated it went through and they will get it ready. I called [name] and informed pharmacy said it went through and will get it ready and to pick up no sooner than one hour. She voices understanding and appreciation. Jacyln Carmer,RN

## 2021-01-22 NOTE — Progress Notes (Signed)
Teaching complete 

## 2021-01-23 ENCOUNTER — Encounter: Payer: Medicaid Other | Admitting: Obstetrics and Gynecology

## 2021-01-24 ENCOUNTER — Ambulatory Visit: Payer: Self-pay

## 2021-01-24 NOTE — Lactation Note (Signed)
This note was copied from a baby's chart. Lactation Consultation Note LC to room for f/u visit with mother of 5-day twins. She is pumping frequently and denies breast engorgement.  Patient Name: Lacey [name] Date: 01/24/2021 Reason for consult: NICU baby;Follow-up assessment;Multiple gestation Age:21 days   Feeding Mother's Current Feeding Choice: Breast Milk and Donor Milk  Interventions Interventions: Education  Consult Status Consult Status: Follow-up Follow-up type: In-patient   Gwynne Edinger, MA IBCLC 01/24/2021, 2:54 PM

## 2021-01-27 ENCOUNTER — Ambulatory Visit: Payer: Self-pay

## 2021-01-27 NOTE — Lactation Note (Signed)
This note was copied from a baby's chart. Lactation Consultation Note  Patient Name: Lacey [name] Date: 01/27/2021 Reason for consult: Follow-up assessment;Mother's request;NICU baby;Late-preterm 34-36.6wks;Multiple gestation;Infant < 6lbs;Primapara;1st time breastfeeding Age:21 days  LC in to visit with P2 Mom of twins.   Umbilical cord drug screen positive for THC.  Mom has a history of polysubstance abuse, but denies use during pregnancy.  SW following.  Babies being fed formula and EBM when available.  Mom had questions regarding pumping/breastfeeding and smoking cigarettes.  Printed out information on nicotine and breastfeeding.  Basically, Mom encouraged to quit for her health as well as health of babies.  Half life of nicotine is 2 hrs.  Mom encouraged to pump or breastfeed prior to smoking.  Mom states she is going to quit now.  Praised her for her commitment to providing breast milk for her babies.  Mom has been pumping about 4 times per 24 hrs for volumes of 120-240 ml.  Talked to Mom about increasing frequency of her pumping to 6 times per 24 hrs and then 8 times due to having twins and importance of supporting a full milk supply.   Provided band for hand's free pumping as Mom was pumping one breast at a time.  Mom was also compressing her breasts by pushing flange into breast.  Mom will massage her breasts during pumping now she has free hands.    Encouraged STS with babies.   Lactation Tools Discussed/Used Tools: Pump;Flanges Flange Size: 24 Breast pump type: Double-Electric Breast Pump Pumping frequency: 4 times per 24 hrs Pumped volume: 120 mL (120-240 ml depending on time since last pumping)  Interventions Interventions: Breast feeding basics reviewed;Skin to skin;Breast massage;Hand express;DEBP;Education  Consult Status Consult Status: Follow-up Date: 02/03/21 Follow-up type: [place] 01/27/2021, 11:11 AM

## 2021-01-28 ENCOUNTER — Ambulatory Visit (INDEPENDENT_AMBULATORY_CARE_PROVIDER_SITE_OTHER): Payer: Medicaid Other

## 2021-01-28 ENCOUNTER — Other Ambulatory Visit: Payer: Self-pay

## 2021-01-28 DIAGNOSIS — Z5189 Encounter for other specified aftercare: Secondary | ICD-10-CM

## 2021-01-28 NOTE — Progress Notes (Signed)
Subjective:     Lacey [name] is a 20 y.o. female who presents to the clinic 1 weeks status post LTCS for Incision check. Eating a regular diet without difficulty. Bowel movements are normal. The patient is not having any pain.   Review of Systems Pertinent items are noted in HPI.    Objective:    BP 130/85   Pulse (!) 105   Ht 5\' 1"  (1.549 m)   Wt 189 lb (85.7 kg)   Breastfeeding Yes   BMI 35.71 kg/m  General:  alert and no distress  Abdomen: soft, bowel sounds active, non-tender  Incision:   healing well, no drainage, no erythema, no hernia, no seroma, no swelling,  no dehiscence, incision well approximated     Assessment:    Doing well postoperatively.   Plan:    1. Continue any current medications. 2. Wound care discussed. 3. Activity restrictions: none 4. Anticipated return to work: not applicable. 5. Follow up: 4 weeks for  Postpartum Visit  Tamela Oddi, RMA

## 2021-01-31 ENCOUNTER — Ambulatory Visit: Payer: Self-pay

## 2021-01-31 NOTE — Lactation Note (Signed)
This note was copied from a baby's chart. Lactation Consultation Note  Patient Name: Lacey [name] [id number] Date: 01/31/2021 Reason for consult: Follow-up assessment;Primapara;1st time breastfeeding;NICU baby;Late-preterm 34-36.6wks;Infant < 6lbs Age:21 days   LC in to assist/assess positioning and latch to the breast for the first time.  Mom pre-pumped right before, for 120 ml.  Mom has large, heavy breasts.  Rolled up cloth placed under breast for support.    Baby showing vigorous feeding cues.  Assist Mom to position baby in football hold using a 20 mm nipple shield (tried without shield and baby unable to sustain latch).  Baby mostly doing non-nutritive sucking.  One swallow identified within 15 mins on the breast and 3 mins of sucking.    When baby fell asleep, milk noted in shield and nipple pulled a little further into shield.  Talked about placing baby STS on Mom's chest to avoid baby choking on milk when she is sleeping on the breast.  Lactation to F/U prn  LATCH Score Latch: Repeated attempts needed to sustain latch, nipple held in mouth throughout feeding, stimulation needed to elicit sucking reflex.  Audible Swallowing: None (1 swallow identified)  Type of Nipple: Everted at rest and after stimulation (short nipple shafts)  Comfort (Breast/Nipple): Soft / non-tender  Hold (Positioning): Assistance needed to correctly position infant at breast and maintain latch.  LATCH Score: 6   Lactation Tools Discussed/Used Tools: Nipple [place] Fuel;Pump Nipple shield size: 20 Breast pump type: Double-Electric Breast Pump Pumping frequency: 6 X 24 hrs Pumped volume: 120 mL  Interventions Interventions: Breast feeding basics reviewed;Assisted with latch;Skin to skin;Breast massage;Hand express;Pre-pump if needed;Breast compression;Adjust position;Support pillows;Position options;Expressed milk;DEBP;Education  Consult Status Consult Status: Follow-up Date:  02/07/21 Follow-up type: In-patient    Broadus John 01/31/2021, 2:52 PM

## 2021-02-02 ENCOUNTER — Ambulatory Visit: Payer: Self-pay

## 2021-02-02 NOTE — Lactation Note (Signed)
This note was copied from a baby's chart. Lactation Consultation Note  Patient Name: Lacey [name] [id number] Date: 02/02/2021   Age:21 wk.o.   RN requested LC to speak with Mom regarding her flanges.  Mom had misplaced one 27 mm flange when babies moved to room 350.   Mom was proud to inform [place] that she has increased her pumping to 8 times per 24 hrs.  Praised Mom for her commitment to supporting her milk supply.   LC noted that pump parts with in bin with packaged bottles of milk and ink pens.    Provided a wash bin and a clean drying bin.  LC rewashed pump parts educating Mom about importance of disassembling pump parts, washing in warm water, rinsing in clear water and air drying on clean paper towels away from sink.  Explained importance of this regarding keeping bacteria and mold from her pump parts.   Mom is continuing offering breast with baby A when she cues.  F/U with LC on 6/9 at 12 noon feeding.   Broadus John 02/02/2021, 9:17 AM

## 2021-02-06 ENCOUNTER — Ambulatory Visit: Payer: Self-pay

## 2021-02-06 NOTE — Lactation Note (Signed)
This note was copied from a baby's chart. Lactation Consultation Note  Patient Name: Cathline Dowen [id number] Date: 02/06/2021 Reason for consult: Follow-up assessment;Primapara;1st time breastfeeding;NICU baby Age:21 wk.o.  LC in to speak with P50 Mom of LPT twins in the NICU.  Both babies are now bottle/gavage feeding.  Mom is not wanting to latch babies presently.  Mom aware of OP lactation support available to her.  Mom given another elastic band for make a hand's free pumping bra.    Mom has been pumping every 2 hrs during the day, but sleeping all night.  She states she has been pumping 6-7 times per 24 hrs.  This is a great improvement.  Recommended more 2-4 hrs intervals with one pumping between 2-5 am due to elevated prolactin hormone.  Mom to ask for LC prn.  Lactation Tools Discussed/Used Tools: Pump Breast pump type: Double-Electric Breast Pump Pumping frequency: Q 2 hrs during the day, sleeps all night (6-7 times per day) Pumped volume: 120 mL  Interventions Interventions: Breast feeding basics reviewed;Skin to skin;Breast massage;Hand express;DEBP;Education   Consult Status Consult Status: Follow-up Date: 02/13/21 Follow-up type: In-patient    Broadus John 02/06/2021, 1:19 PM

## 2021-02-14 ENCOUNTER — Ambulatory Visit: Payer: Self-pay

## 2021-02-14 NOTE — Lactation Note (Signed)
This note was copied from a baby's chart. Lactation Consultation Note Mother has abundant milk supply. Will plan return visit on Sunday to assist with latch.   Patient Name: Lacey [name] [id number] Date: 02/14/2021 Reason for consult: Multiple gestation;Follow-up assessment Age:21 wk.o.   Feeding Mother's Current Feeding Choice: Breast Milk and Formula Nipple Type: Dr. Myra Gianotti Preemie   Lactation Tools Discussed/Used Pumping frequency: 6x Pumped volume: 240 mL  Interventions Interventions: Education   Consult Status Consult Status: Follow-up Date: 02/16/21 Follow-up type: [name], [place] IBCLC 02/14/2021, 2:58 PM

## 2021-02-16 ENCOUNTER — Ambulatory Visit: Payer: Self-pay

## 2021-02-16 NOTE — Lactation Note (Signed)
This note was copied from a baby's chart. Lactation Consultation Note  Patient Name: Phylis Javed [id number] Date: 02/16/2021   Age:21 wk.o.  Baby girl A is being discharged today.  Mom has been bottle feeding formula and fortified EBM.  Mom states she would like OP lactation f/u and is taking babies to [facility].  Message sent to [place].   Broadus John 02/16/2021, 2:01 PM

## 2021-02-17 ENCOUNTER — Ambulatory Visit: Payer: Medicaid Other | Admitting: Obstetrics & Gynecology

## 2021-03-04 ENCOUNTER — Ambulatory Visit (INDEPENDENT_AMBULATORY_CARE_PROVIDER_SITE_OTHER): Payer: Medicaid Other | Admitting: Licensed Clinical Social Worker

## 2021-03-04 DIAGNOSIS — Z9189 Other specified personal risk factors, not elsewhere classified: Secondary | ICD-10-CM

## 2021-03-05 ENCOUNTER — Ambulatory Visit: Payer: Medicaid Other | Admitting: Obstetrics

## 2021-03-06 NOTE — BH Specialist Note (Signed)
Integrated Behavioral Health via Telemedicine Visit  03/06/2021 KHADEEJAH CASTNER [id number]  Number of [place] visits: 2/6 Session Start time: 1:00pm  Session End time: 1:30pm Total time: 30 mins via mychart video   Referring Provider: Rosendo Gros MD  Patient/Family location: Home  [facility] Provider location: [place]  All persons participating in visit: Pt F [place] and LCSWA A. Linton Rump  Types of Service: [place] (BHI)  I connected with Wilkie Aye and/or [place] via  Telephone or Geologist, engineering  (Video is Tree surgeon) and verified that I am speaking with the correct person using two identifiers. Discussed confidentiality: Yes   I discussed the limitations of telemedicine and the availability of in person appointments.  Discussed there is a possibility of technology failure and discussed alternative modes of communication if that failure occurs.  I discussed that engaging in this telemedicine visit, they consent to the provision of behavioral healthcare and the services will be billed under their insurance.  Patient and/or legal guardian expressed understanding and consented to Telemedicine visit: Yes   Presenting Concerns: Patient and/or family reports the following symptoms/concerns: depression and psychosocial stress  Duration of problem: over one year ; Severity of problem: moderate  Patient and/or Family's Strengths/Protective Factors: N/a  Goals Addressed: Patient will:  Reduce symptoms of: depression and stress   Increase knowledge and/or ability of: coping skills and stress reduction   Demonstrate ability to: Increase healthy adjustment to current life circumstances and Increase adequate support systems for patient/family  Progress towards Goals: Ongoing  Interventions: Interventions utilized:  Solution-Focused Strategies and Supportive Counseling Standardized Assessments  completed: PHQ 9  Patient and/or Family Response: Ms. Tibbs reports twins are discharged from nicu and bonding well. Ms. Newstrom reports family still currently residing in a hotel and family is supportive. Ms. Klink reports no change in mood.   Assessment: Patient at risk for postpartum depression.   Patient may benefit from wraparound services and case management.  Plan: Follow up with behavioral health clinician on : 3 weeks via mychart.  Behavioral recommendations: Contact housing and education resources given, keep scheduled medical appts, take prescribe medication as directed  Referral(s): Community Resources:  Housing  I discussed the assessment and treatment plan with the patient and/or parent/guardian. They were provided an opportunity to ask questions and all were answered. They agreed with the plan and demonstrated an understanding of the instructions.   They were advised to call back or seek an in-person evaluation if the symptoms worsen or if the condition fails to improve as anticipated.  Lynnea Ferrier, LCSW

## 2021-07-03 DIAGNOSIS — H40033 Anatomical narrow angle, bilateral: Secondary | ICD-10-CM | POA: Diagnosis not present

## 2021-07-03 DIAGNOSIS — H04123 Dry eye syndrome of bilateral lacrimal glands: Secondary | ICD-10-CM | POA: Diagnosis not present

## 2021-07-26 NOTE — Progress Notes (Signed)
Subjective:    Lacey [name] - 20 y.o. female MRN [id number]  Date of birth: 2001/03/17  HPI  Lacey [name] is to establish care and annual physical exam.   Current issues and/or concerns: ANXIETY DEPRESSION: History of the same for years. Currently primarily related to work-life balance. New mother of twins (33 months old). History of self-cutting and the last time being when was 20 years-old. Reports did think about cutting herself recently but had her twins with her so did not do so. Tried counseling in the past and did not like it. Denies thoughts of suicidal ideations and homicidal ideations. No active thoughts of self-harm. She is interested in beginning medication. She is not breastfeeding.  2. PREGNANCY TEST: Requesting test. Unsure of date of LMP but reports she did have one sometime last month. Having some stomach cramping so may be starting soon.  Depression screen [facility] 2/9 08/01/2021 01/06/2021 07/10/2020 11/13/2019  Decreased Interest 3 3 0 2  Down, Depressed, Hopeless '3 2 3 3  ' PHQ - 2 Score '6 5 3 5  ' Altered sleeping 3 3 0 2  Tired, decreased energy '3 3 3 3  ' Change in appetite 3 3 0 3  Feeling bad or failure about yourself  3 0 2 3  Trouble concentrating 1 0 0 3  Moving slowly or fidgety/restless 1 0 0 2  Suicidal thoughts 3 0 0 -  PHQ-9 Score '23 14 8 21  ' Difficult doing work/chores - Somewhat difficult - -    ROS per HPI    Health Maintenance:  Health Maintenance Due  Topic Date Due   COVID-19 Vaccine (1) Never done   Pneumococcal Vaccine 81-39 Years old (1 - PPSV23 if available, else PCV20) 10/26/2003   INFLUENZA VACCINE  03/31/2021   PAP-Cervical Cytology Screening  Never done   PAP SMEAR-Modifier  Never done     Past Medical History: Patient Active Problem List   Diagnosis Date Noted   Anxiety and depression 08/01/2021   Mild preeclampsia 01/19/2021   [redacted] weeks gestation of pregnancy 01/14/2021   Monochorionic diamniotic twin gestation in third  trimester 01/14/2021   Monochorionic diamniotic twin gestation in second trimester 09/11/2020   Group B streptococcal bacteriuria 08/21/2020   Hx of herpes simplex type 2 infection 08/14/2020   Supervision of high risk pregnancy, antepartum 08/14/2020   Encounter for supervision of high risk pregnancy in first trimester, antepartum 07/10/2020   Obesity in pregnancy 11/27/2019   MDD (major depressive disorder), recurrent severe, without psychosis ([place]) 11/09/2018   Acute encephalopathy 11/09/2017   Mild renal insufficiency 11/09/2017   Anemia 11/09/2017   Thrombocytosis 11/09/2017   Other constipation 08/13/2016   MDD (major depressive disorder), recurrent episode, severe ([place]) 11/21/2013   ADHD (attention deficit hyperactivity disorder), combined type 11/21/2013    Social History   reports that she has been smoking cigarettes. She has never used smokeless tobacco. She reports that she does not currently use alcohol. She reports that she does not currently use drugs after having used the following drugs: "Crack" cocaine, Cocaine, Other-see comments, Amphetamines, Methamphetamines, and Marijuana. Frequency: 1.00 time per week.   Family History  family history includes Aneurysm in her mother; COPD in her father; Cancer in her sister; Hypertension in her mother; Stroke in her mother.   Medications: reviewed and updated   Objective:   Physical Exam BP 124/82 (BP Location: Left Arm, Patient Position: Sitting, Cuff Size: Large)   Pulse 88   Temp 98 F (36.7  C)   Resp 18   Ht 5' 1.38" (1.559 m)   Wt 202 lb (91.6 kg)   SpO2 97%   Breastfeeding No   BMI 37.70 kg/m   Physical Exam Exam conducted with a chaperone present.  HENT:     Head: Normocephalic and atraumatic.     Right Ear: Tympanic membrane, ear canal and external ear normal.     Left Ear: Tympanic membrane, ear canal and external ear normal.  Eyes:     Extraocular Movements: Extraocular movements intact.      Conjunctiva/sclera: Conjunctivae normal.     Pupils: Pupils are equal, round, and reactive to light.  Cardiovascular:     Rate and Rhythm: Normal rate and regular rhythm.     Pulses: Normal pulses.     Heart sounds: Normal heart sounds.  Pulmonary:     Effort: Pulmonary effort is normal.     Breath sounds: Normal breath sounds.  Chest:  Breasts:    Right: Normal.     Left: Normal.     Comments: Elmon Else, CMA present during exam.  Abdominal:     General: Bowel sounds are normal.     Palpations: Abdomen is soft.     Tenderness: There is abdominal tenderness.     Comments: Tenderness with palpation of abdomen at hypogastrium.  Genitourinary:    General: Normal vulva.     Vagina: Normal.     Cervix: Normal.     Uterus: Normal.      Adnexa: Right adnexa normal and left adnexa normal.     Comments: Elmon Else, CMA present during exam. Musculoskeletal:        General: Normal range of motion.     Cervical back: Normal range of motion and neck supple.  Skin:    General: Skin is warm and dry.     Capillary Refill: Capillary refill takes less than 2 seconds.  Neurological:     General: No focal deficit present.     Mental Status: She is alert and oriented to person, place, and time.  Psychiatric:        Mood and Affect: Mood normal.        Behavior: Behavior normal.   Results for orders placed or performed in visit on 08/01/21  POCT urine pregnancy  Result Value Ref Range   Preg Test, Ur Negative Negative  POCT URINALYSIS DIP (CLINITEK)  Result Value Ref Range   Color, UA yellow yellow   Clarity, UA turbid (A) clear   Glucose, UA negative negative mg/dL   Bilirubin, UA negative negative   Ketones, POC UA negative negative mg/dL   Spec Grav, UA 1.025 1.010 - 1.025   Blood, UA negative negative   pH, UA 7.0 5.0 - 8.0   POC PROTEIN,UA =100 (A) negative, trace   Urobilinogen, UA 0.2 0.2 or 1.0 E.U./dL   Nitrite, UA Negative Negative   Leukocytes, UA Negative  Negative      Assessment & Plan:  1. Encounter to establish care: 2. Annual physical exam: - Counseled on 150 minutes of exercise per week as tolerated, healthy eating (including decreased daily intake of saturated fats, cholesterol, added sugars, sodium), STI prevention, and routine healthcare maintenance.  3. Screening for metabolic disorder: - QMG86+PYPP to check kidney function, liver function, and electrolyte balance.  - CMP14+EGFR  4. Screening for deficiency anemia: - CBC to screen for anemia. - CBC  5. Diabetes mellitus screening: - Hemoglobin A1c to screen for pre-diabetes/diabetes. - Hemoglobin  A1c  6. Screening cholesterol level: - Lipid panel to screen for high cholesterol.  - Lipid panel  7. Thyroid disorder screen: - TSH to check thyroid function.  - TSH  8. Pap smear for cervical cancer screening: - Cytology - PAP for cervical cancer screening.  - Cytology - PAP([place])  9. Screening for STD (sexually transmitted disease): - Cervicovaginal self-swab to screen for chlamydia, gonorrhea, trichomonas, bacterial vaginitis, and candida vaginitis. - Cervicovaginal ancillary only  10. Encounter for pregnancy test, result unknown: - Urine pregnancy negative today in office. - POCT urine pregnancy  11. Urinary frequency: - No urinary tract infection. - POCT URINALYSIS DIP (CLINITEK)  12. Anxiety and depression: - Patient denies thoughts of self-harm, suicidal ideations, homicidal ideations. - Begin Sertraline as prescribed. Counseled on medication compliance and adverse effects.  Do not drink alcohol or use illicit substances with with this medication.  Avoid driving or hazardous activity until you know how this medication will affect you. Your reactions could be impaired. Dizziness or fainting can cause falls, accidents, or severe injuries. Common side effects include drowsiness, nausea, constipation, loss of appetite, dry mouth, increased sweating. Call  your provider if you have pounding heartbeats or fluttering in your chest, a light-headed feeling like you may pass out, easy bruising/unusal bleeding, vision change, difficult or painful urination, impotence/sexual problems, liver problems (right-sided upper stomach pain, itching, dark urine, yellowing of skin or eyes/jaundice, low levels of sodium in the body (headache, confusion, slurred speech, severe weakness, vomiting, loss of coordination, feeling unsteady), or manic episodes (racing thoughts, increased energy, decreased need for sleep, risk-taking behavior, being agitated, talkative) Seek medical attention immediately if you have symptoms of serotonin syndrome such as agitation, hallucinations, fever, sweating, shivering, fast heart rate, muscle stiffness, twitching, loss of coordination, nausea, vomiting, or diarrhea Report any new or worsening symptoms to your provider, such as but not limited to: mood or behavior changes, anxiety, panic attacks, trouble sleeping, or if you feel impulsive, irritable, agitated, hostile, aggressive, restless, hyperactive (mentally or physically), more depressed, or have thoughts about suicide or hurting yourself - Patient declined referral to counseling services.  - Follow-up with primary provider in 4 weeks or sooner if needed. - sertraline (ZOLOFT) 25 MG tablet; Take 1 tablet (25 mg total) by mouth daily.  Dispense: 30 tablet; Refill: 0     Patient was given clear instructions to go to Emergency Department or return to medical center if symptoms don't improve, worsen, or new problems develop.The patient verbalized understanding.  I discussed the assessment and treatment plan with the patient. The patient was provided an opportunity to ask questions and all were answered. The patient agreed with the plan and demonstrated an understanding of the instructions.   The patient was advised to call back or seek an in-person evaluation if the symptoms worsen or if the  condition fails to improve as anticipated.    Durene Fruits, NP 08/01/2021, 3:40 PM Primary Care at [facility]

## 2021-08-01 ENCOUNTER — Ambulatory Visit (INDEPENDENT_AMBULATORY_CARE_PROVIDER_SITE_OTHER): Payer: Medicaid Other | Admitting: Family

## 2021-08-01 ENCOUNTER — Other Ambulatory Visit: Payer: Self-pay

## 2021-08-01 ENCOUNTER — Encounter: Payer: Self-pay | Admitting: Family

## 2021-08-01 ENCOUNTER — Other Ambulatory Visit (HOSPITAL_COMMUNITY)
Admission: RE | Admit: 2021-08-01 | Discharge: 2021-08-01 | Disposition: A | Discharge status: Home / Self Care | Payer: Medicaid Other | Source: Ambulatory Visit | Attending: Family | Admitting: Family

## 2021-08-01 VITALS — BP 124/82 | HR 88 | Temp 98.0°F | Resp 18 | Ht 61.38 in | Wt 202.0 lb

## 2021-08-01 DIAGNOSIS — Z113 Encounter for screening for infections with a predominantly sexual mode of transmission: Secondary | ICD-10-CM

## 2021-08-01 DIAGNOSIS — F32A Depression, unspecified: Secondary | ICD-10-CM

## 2021-08-01 DIAGNOSIS — Z130 Encounter for screening for diseases of the blood and blood-forming organs and certain disorders involving the immune mechanism: Secondary | ICD-10-CM

## 2021-08-01 DIAGNOSIS — Z7689 Persons encountering health services in other specified circumstances: Secondary | ICD-10-CM

## 2021-08-01 DIAGNOSIS — Z131 Encounter for screening for diabetes mellitus: Secondary | ICD-10-CM

## 2021-08-01 DIAGNOSIS — Z13228 Encounter for screening for other metabolic disorders: Secondary | ICD-10-CM

## 2021-08-01 DIAGNOSIS — Z13220 Encounter for screening for lipoid disorders: Secondary | ICD-10-CM

## 2021-08-01 DIAGNOSIS — R350 Frequency of micturition: Secondary | ICD-10-CM

## 2021-08-01 DIAGNOSIS — Z0001 Encounter for general adult medical examination with abnormal findings: Secondary | ICD-10-CM

## 2021-08-01 DIAGNOSIS — Z124 Encounter for screening for malignant neoplasm of cervix: Secondary | ICD-10-CM | POA: Insufficient documentation

## 2021-08-01 DIAGNOSIS — Z1329 Encounter for screening for other suspected endocrine disorder: Secondary | ICD-10-CM | POA: Diagnosis not present

## 2021-08-01 DIAGNOSIS — Z0000 Encounter for general adult medical examination without abnormal findings: Secondary | ICD-10-CM

## 2021-08-01 DIAGNOSIS — Z3202 Encounter for pregnancy test, result negative: Secondary | ICD-10-CM

## 2021-08-01 DIAGNOSIS — F419 Anxiety disorder, unspecified: Secondary | ICD-10-CM | POA: Diagnosis not present

## 2021-08-01 DIAGNOSIS — Z3200 Encounter for pregnancy test, result unknown: Secondary | ICD-10-CM

## 2021-08-01 LAB — POCT URINALYSIS DIP (CLINITEK)
Bilirubin, UA: NEGATIVE
Blood, UA: NEGATIVE
Glucose, UA: NEGATIVE mg/dL
Ketones, POC UA: NEGATIVE mg/dL
Leukocytes, UA: NEGATIVE
Nitrite, UA: NEGATIVE
POC PROTEIN,UA: 100 — AB
Spec Grav, UA: 1.025 (ref 1.010–1.025)
Urobilinogen, UA: 0.2 E.U./dL
pH, UA: 7 (ref 5.0–8.0)

## 2021-08-01 LAB — POCT URINE PREGNANCY: Preg Test, Ur: NEGATIVE

## 2021-08-01 MED ORDER — SERTRALINE HCL 25 MG PO TABS: 25.0000 mg | ORAL_TABLET | Freq: Every day | ORAL | 0 refills | Status: AC

## 2021-08-01 NOTE — Progress Notes (Signed)
No urinary tract infection.   Urine pregnancy negative.

## 2021-08-01 NOTE — Progress Notes (Signed)
Pt presents to establish care and annual physical exam  °

## 2021-08-01 NOTE — Patient Instructions (Addendum)
Thank you for choosing Primary Care at [facility] for your medical home!    Lacey [name] was seen by Camillia Herter, NP today.   Carlisle Beers Fuston's primary care provider is Durene Fruits, NP.   For the best care possible,  you should try to see Durene Fruits, NP whenever you come to clinic.   We look forward to seeing you again soon!  If you have any questions about your visit today,  please call us at [phone]  Or feel free to reach your provider via [place].    Preventive Care 6-65 Years Old, Female Preventive care refers to lifestyle choices and visits with your health care provider that can promote health and wellness. Preventive care visits are also called wellness exams. What can I expect for my preventive care visit? Counseling During your preventive care visit, your health care provider may ask about your: Medical history, including: Past medical problems. Family medical history. Pregnancy history. Current health, including: Menstrual cycle. Method of birth control. Emotional well-being. Home life and relationship well-being. Sexual activity and sexual health. Lifestyle, including: Alcohol, nicotine or tobacco, and drug use. Access to firearms. Diet, exercise, and sleep habits. Work and work Statistician. Sunscreen use. Safety issues such as seatbelt and bike helmet use. Physical exam Your health care provider may check your: Height and weight. These may be used to calculate your BMI (body mass index). BMI is a measurement that tells if you are at a healthy weight. Waist circumference. This measures the distance around your waistline. This measurement also tells if you are at a healthy weight and may help predict your risk of certain diseases, such as type 2 diabetes and high blood pressure. Heart rate and blood pressure. Body temperature. Skin for abnormal spots. What immunizations do I need? Vaccines are usually given at various ages, according to a  schedule. Your health care provider will recommend vaccines for you based on your age, medical history, and lifestyle or other factors, such as travel or where you work. What tests do I need? Screening Your health care provider may recommend screening tests for certain conditions. This may include: Pelvic exam and Pap test. Lipid and cholesterol levels. Diabetes screening. This is done by checking your blood sugar (glucose) after you have not eaten for a while (fasting). Hepatitis B test. Hepatitis C test. HIV (human immunodeficiency virus) test. STI (sexually transmitted infection) testing, if you are at risk. BRCA-related cancer screening. This may be done if you have a family history of breast, ovarian, tubal, or peritoneal cancers. Talk with your health care provider about your test results, treatment options, and if necessary, the need for more tests. Follow these instructions at home: Eating and drinking  Eat a healthy diet that includes fresh fruits and vegetables, whole grains, lean protein, and low-fat dairy products. Take vitamin and mineral supplements as recommended by your health care provider. Do not drink alcohol if: Your health care provider tells you not to drink. You are pregnant, may be pregnant, or are planning to become pregnant. If you drink alcohol: Limit how much you have to 0-1 drink a day. Know how much alcohol is in your drink. In the [place] one drink equals one 12 oz bottle of beer (355 mL), one 5 oz glass of wine (148 mL), or one 1 oz glass of hard liquor (44 mL). Lifestyle Brush your teeth every morning and night with fluoride toothpaste. Floss one time each day. Exercise for at least 30 minutes 5  or more days each week. Do not use any products that contain nicotine or tobacco. These products include cigarettes, chewing tobacco, and vaping devices, such as e-cigarettes. If you need help quitting, ask your health care provider. Do not use drugs. If you are  sexually active, practice safe sex. Use a condom or other form of protection to prevent STIs. If you do not wish to become pregnant, use a form of birth control. If you plan to become pregnant, see your health care provider for a prepregnancy visit. Find healthy ways to manage stress, such as: Meditation, yoga, or listening to music. Journaling. Talking to a trusted person. Spending time with friends and family. Minimize exposure to UV radiation to reduce your risk of skin cancer. Safety Always wear your seat belt while driving or riding in a vehicle. Do not drive: If you have been drinking alcohol. Do not ride with someone who has been drinking. If you have been using any mind-altering substances or drugs. While texting. When you are tired or distracted. Wear a helmet and other protective equipment during sports activities. If you have firearms in your house, make sure you follow all gun safety procedures. Seek help if you have been physically or sexually abused. What's next? Go to your health care provider once a year for an annual wellness visit. Ask your health care provider how often you should have your eyes and teeth checked. Stay up to date on all vaccines. This information is not intended to replace advice given to you by your health care provider. Make sure you discuss any questions you have with your health care provider. Document Revised: 02/12/2021 Document Reviewed: 02/12/2021 Elsevier Patient Education  [place].

## 2021-08-04 ENCOUNTER — Other Ambulatory Visit: Payer: Self-pay | Admitting: Family

## 2021-08-04 DIAGNOSIS — N760 Acute vaginitis: Secondary | ICD-10-CM | POA: Insufficient documentation

## 2021-08-04 DIAGNOSIS — B9689 Other specified bacterial agents as the cause of diseases classified elsewhere: Secondary | ICD-10-CM

## 2021-08-04 LAB — CBC
Hematocrit: 37.8 % (ref 34.0–46.6)
Hemoglobin: 12.1 g/dL (ref 11.1–15.9)
MCH: 23.7 pg — ABNORMAL LOW (ref 26.6–33.0)
MCHC: 32 g/dL (ref 31.5–35.7)
MCV: 74 fL — ABNORMAL LOW (ref 79–97)
Platelets: 420 10*3/uL (ref 150–450)
RBC: 5.1 x10E6/uL (ref 3.77–5.28)
RDW: 17 % — ABNORMAL HIGH (ref 11.7–15.4)
WBC: 6.5 10*3/uL (ref 3.4–10.8)

## 2021-08-04 LAB — CERVICOVAGINAL ANCILLARY ONLY
Bacterial Vaginitis (gardnerella): POSITIVE — AB
Candida Glabrata: NEGATIVE
Candida Vaginitis: NEGATIVE
Chlamydia: NEGATIVE
Comment: NEGATIVE
Comment: NEGATIVE
Comment: NEGATIVE
Comment: NEGATIVE
Comment: NEGATIVE
Comment: NORMAL
Neisseria Gonorrhea: NEGATIVE
Trichomonas: NEGATIVE

## 2021-08-04 MED ORDER — METRONIDAZOLE 500 MG PO TABS: 500.0000 mg | ORAL_TABLET | Freq: Two times a day (BID) | ORAL | 0 refills | Status: AC

## 2021-08-04 NOTE — Progress Notes (Signed)
Gonorrhea, Chlamydia, Trichomonas, and Candida Vaginitis (sometimes called a yeast infection) negative.  Positive for Bacterial Vaginitis, an overgrowth of normal bacteria in the vagina. Prescribed Metronidazole (Flagyl) twice per day for 7 days. Do not drink alcohol while taking this medication as this may cause severe nausea, vomiting, and upset stomach.

## 2021-08-05 LAB — LIPID PANEL
Chol/HDL Ratio: 2.8 ratio (ref 0.0–4.4)
Cholesterol, Total: 155 mg/dL (ref 100–199)
HDL: 56 mg/dL (ref >39)
LDL Chol Calc (NIH): 81 mg/dL (ref 0–99)
Triglycerides: 101 mg/dL (ref 0–149)
VLDL Cholesterol Cal: 18 mg/dL (ref 5–40)

## 2021-08-05 LAB — CMP14+EGFR
ALT: 14 IU/L (ref 0–32)
AST: 16 IU/L (ref 0–40)
Albumin/Globulin Ratio: 1.9 (ref 1.2–2.2)
Albumin: 4.7 g/dL (ref 3.9–5.0)
Alkaline Phosphatase: 91 IU/L (ref 44–121)
BUN/Creatinine Ratio: 10 (ref 9–23)
BUN: 9 mg/dL (ref 6–20)
Bilirubin Total: 0.3 mg/dL (ref 0.0–1.2)
CO2: 22 mmol/L (ref 20–29)
Calcium: 9.6 mg/dL (ref 8.7–10.2)
Chloride: 103 mmol/L (ref 96–106)
Creatinine, Ser: 0.9 mg/dL (ref 0.57–1.00)
Globulin, Total: 2.5 g/dL (ref 1.5–4.5)
Glucose: 82 mg/dL (ref 70–99)
Potassium: 4.7 mmol/L (ref 3.5–5.2)
Sodium: 141 mmol/L (ref 134–144)
Total Protein: 7.2 g/dL (ref 6.0–8.5)
eGFR: 93 mL/min/{1.73_m2} (ref >59)

## 2021-08-05 LAB — CYTOLOGY - PAP: Diagnosis: NEGATIVE

## 2021-08-05 LAB — SPECIMEN STATUS REPORT

## 2021-08-05 LAB — TSH: TSH: 0.538 u[IU]/mL (ref 0.450–4.500)

## 2021-08-05 NOTE — Progress Notes (Signed)
PAP negative for lesion or malignancy. Repeat PAP in 3 years or sooner if needed.

## 2021-08-05 NOTE — Progress Notes (Signed)
Kidney function normal.   Liver function normal.   Thyroid function normal.   Cholesterol normal.   Anemia improved since 6 months ago. Reminder to include iron-rich foods in the diet such as green leafy vegetables.  Per [facility] we will need to recollect diabetes screening. Please call our office and schedule lab only appointment to have completed.

## 2021-08-26 NOTE — Progress Notes (Signed)
Erroneous encounter

## 2021-09-02 ENCOUNTER — Encounter: Payer: Medicaid Other | Admitting: Family

## 2021-10-06 ENCOUNTER — Emergency Department (HOSPITAL_COMMUNITY)
Admission: EM | Admit: 2021-10-06 | Discharge: 2021-10-06 | Disposition: A | Discharge status: Home / Self Care | Payer: Medicaid Other | Attending: Emergency Medicine | Admitting: Emergency Medicine

## 2021-10-06 ENCOUNTER — Emergency Department (HOSPITAL_COMMUNITY): Payer: Medicaid Other

## 2021-10-06 DIAGNOSIS — E876 Hypokalemia: Secondary | ICD-10-CM | POA: Insufficient documentation

## 2021-10-06 DIAGNOSIS — F1290 Cannabis use, unspecified, uncomplicated: Secondary | ICD-10-CM | POA: Diagnosis not present

## 2021-10-06 DIAGNOSIS — R0689 Other abnormalities of breathing: Secondary | ICD-10-CM | POA: Diagnosis not present

## 2021-10-06 DIAGNOSIS — R079 Chest pain, unspecified: Secondary | ICD-10-CM | POA: Diagnosis not present

## 2021-10-06 DIAGNOSIS — R000 Tachycardia, unspecified: Secondary | ICD-10-CM | POA: Diagnosis not present

## 2021-10-06 DIAGNOSIS — J45909 Unspecified asthma, uncomplicated: Secondary | ICD-10-CM | POA: Diagnosis not present

## 2021-10-06 DIAGNOSIS — R0789 Other chest pain: Secondary | ICD-10-CM | POA: Diagnosis not present

## 2021-10-06 DIAGNOSIS — R456 Violent behavior: Secondary | ICD-10-CM | POA: Diagnosis not present

## 2021-10-06 DIAGNOSIS — R0602 Shortness of breath: Secondary | ICD-10-CM | POA: Diagnosis present

## 2021-10-06 DIAGNOSIS — Z79899 Other long term (current) drug therapy: Secondary | ICD-10-CM | POA: Insufficient documentation

## 2021-10-06 DIAGNOSIS — F1990 Other psychoactive substance use, unspecified, uncomplicated: Secondary | ICD-10-CM

## 2021-10-06 DIAGNOSIS — R0902 Hypoxemia: Secondary | ICD-10-CM | POA: Diagnosis not present

## 2021-10-06 LAB — BASIC METABOLIC PANEL
Anion gap: 12 (ref 5–15)
BUN: 14 mg/dL (ref 6–20)
CO2: 18 mmol/L — ABNORMAL LOW (ref 22–32)
Calcium: 8.8 mg/dL — ABNORMAL LOW (ref 8.9–10.3)
Chloride: 108 mmol/L (ref 98–111)
Creatinine, Ser: 0.89 mg/dL (ref 0.44–1.00)
GFR, Estimated: >60 mL/min (ref >60)
Glucose, Bld: 90 mg/dL (ref 70–99)
Potassium: 3 mmol/L — ABNORMAL LOW (ref 3.5–5.1)
Sodium: 138 mmol/L (ref 135–145)

## 2021-10-06 LAB — CBC WITH DIFFERENTIAL/PLATELET
Abs Immature Granulocytes: 0.02 10*3/uL (ref 0.00–0.07)
Basophils Absolute: 0 10*3/uL (ref 0.0–0.1)
Basophils Relative: 0 %
Eosinophils Absolute: 0.1 10*3/uL (ref 0.0–0.5)
Eosinophils Relative: 1 %
HCT: 38.5 % (ref 36.0–46.0)
Hemoglobin: 11.9 g/dL — ABNORMAL LOW (ref 12.0–15.0)
Immature Granulocytes: 0 %
Lymphocytes Relative: 35 %
Lymphs Abs: 2.5 10*3/uL (ref 0.7–4.0)
MCH: 23.9 pg — ABNORMAL LOW (ref 26.0–34.0)
MCHC: 30.9 g/dL (ref 30.0–36.0)
MCV: 77.5 fL — ABNORMAL LOW (ref 80.0–100.0)
Monocytes Absolute: 0.4 10*3/uL (ref 0.1–1.0)
Monocytes Relative: 5 %
Neutro Abs: 4.2 10*3/uL (ref 1.7–7.7)
Neutrophils Relative %: 59 %
Platelets: 343 10*3/uL (ref 150–400)
RBC: 4.97 MIL/uL (ref 3.87–5.11)
RDW: 15.2 % (ref 11.5–15.5)
WBC: 7.2 10*3/uL (ref 4.0–10.5)
nRBC: 0 % (ref 0.0–0.2)

## 2021-10-06 MED ORDER — SODIUM CHLORIDE 0.9 % IV BOLUS
1000.0000 mL | Freq: Once | INTRAVENOUS | Status: AC
  Administered 2021-10-06: 1000 mL via INTRAVENOUS

## 2021-10-06 MED ORDER — LORAZEPAM 1 MG PO TABS: 1.0000 mg | ORAL_TABLET | Freq: Once | ORAL | Status: DC

## 2021-10-06 MED ORDER — LORAZEPAM 2 MG/ML IJ SOLN
1.0000 mg | Freq: Once | INTRAMUSCULAR | Status: DC
  Filled 2021-10-06: qty 1

## 2021-10-06 MED ORDER — POTASSIUM CHLORIDE CRYS ER 20 MEQ PO TBCR
40.0000 meq | EXTENDED_RELEASE_TABLET | Freq: Once | ORAL | Status: AC
  Administered 2021-10-06: 40 meq via ORAL
  Filled 2021-10-06: qty 2

## 2021-10-06 NOTE — Discharge Instructions (Addendum)
As we discussed your work-up today is overall unremarkable, believe that you had an adverse reaction to the drugs you are using prior to arrival.  You appear stable at this time to rest, return to your normal activity, I encourage any of sleep, hydration, and discontinuation of all drugs or alcohol at this time.  I have attached resources for substance abuse rehabilitation, as well as counseling services.  Continue to use especially of cocaine, crack can cause serious damage to your heart, is important for your health that you stop at this time.  Your potassium which is an electrolyte that is important in your heart function among other things was slightly low today, we have given you some replacement, but I encourage you to continue to eat natural sources of potassium, as well as take a multivitamin daily.  Please follow-up with your primary care provider for recheck at your earliest convenience.

## 2021-10-06 NOTE — ED Triage Notes (Signed)
Pt BIB [facility] after calling for chest pain. Patient endorses crack and marijuana use. Patient found walking down the street after being missing from home for 2 days. EMS reports patient combative en route, administered 5mg  versed total. Patient initially 160HR, down to 110 after meds. 1553mL NS bolus administered en route.

## 2021-10-06 NOTE — ED Provider Notes (Signed)
[facility] EMERGENCY DEPARTMENT Provider Note   CSN: [id number] Arrival date & time: 10/06/21  1611     History  Chief Complaint  Patient presents with   Chest Pain    Drug use    Lacey [name] is a 21 y.o. female This is a 21 year old female who reports a history of asthma who presents via EMS after consumption of marijuana, crack smoking with concern for feeling of shortness of breath, panic, heart racing.  Patient at this time denies chest pain.  Patient unclear if there is anything else and drugs other than the crack she was smoking.  She received 5 mg Versed, as well as 1500 mL fluid bolus prior to arrival.  Heart rate for EMS was 160, on my evaluation initially she is sitting around 102.     Chest Pain Associated symptoms: palpitations and shortness of breath       Home Medications Prior to Admission medications   Medication Sig Start Date End Date Taking? Authorizing Provider  Blood Pressure Monitoring (BLOOD PRESSURE KIT) DEVI 1 kit by Does not apply route once a week. Patient not taking: Reported on 01/28/2021 07/10/20   Sloan Leiter, MD  NIFEdipine (ADALAT CC) 30 MG 24 hr tablet Take 1 tablet (30 mg total) by mouth daily. Patient not taking: Reported on 01/28/2021 01/23/21   Woodroe Mode, MD  sertraline (ZOLOFT) 25 MG tablet Take 1 tablet (25 mg total) by mouth daily. 08/01/21 08/31/21  Camillia Herter, NP  valACYclovir (VALTREX) 500 MG tablet Take 500 mg by mouth 2 (two) times daily.    [provider]      Allergies    Fish allergy and Shellfish allergy    Review of Systems   Review of Systems  Respiratory:  Positive for shortness of breath.   Cardiovascular:  Positive for chest pain and palpitations.  All other systems reviewed and are negative.  Physical Exam Updated Vital Signs BP 94/61    Pulse (!) 108    Temp 98.7 F (37.1 C) (Oral)    Resp (!) 22    SpO2 95%  Physical Exam Vitals and nursing note reviewed.   Constitutional:      General: She is not in acute distress.    Appearance: Normal appearance.     Comments: Anxious appearing 21 year old female who is nontoxic-appearing sitting on bed in some distress  HENT:     Head: Normocephalic and atraumatic.  Eyes:     General:        Right eye: No discharge.        Left eye: No discharge.     Comments: Is equal round reactive to light, they are somewhat wide, approximately 5 mm bilaterally  Cardiovascular:     Rate and Rhythm: Regular rhythm. Tachycardia present.     Heart sounds: No murmur heard.   No friction rub. No gallop.  Pulmonary:     Effort: Pulmonary effort is normal. Tachypnea present.     Breath sounds: Normal breath sounds.  Abdominal:     General: Bowel sounds are normal.     Palpations: Abdomen is soft.     Comments: No tenderness to palpation abdomen  Skin:    General: Skin is warm and dry.     Capillary Refill: Capillary refill takes less than 2 seconds.  Neurological:     Mental Status: She is alert and oriented to person, place, and time.  Psychiatric:  Mood and Affect: Mood normal.        Behavior: Behavior normal.    ED Results / Procedures / Treatments   Labs (all labs ordered are listed, but only abnormal results are displayed) Labs Reviewed  CBC WITH DIFFERENTIAL/PLATELET - Abnormal; Notable for the following components:      Result Value   Hemoglobin 11.9 (*)    MCV 77.5 (*)    MCH 23.9 (*)    All other components within normal limits  BASIC METABOLIC PANEL - Abnormal; Notable for the following components:   Potassium 3.0 (*)    CO2 18 (*)    Calcium 8.8 (*)    All other components within normal limits    EKG EKG Interpretation  Date/Time:  Monday October 06 2021 16:19:32 EST Ventricular Rate:  98 PR Interval:  137 QRS Duration: 82 QT Interval:  335 QTC Calculation: 428 R Axis:   67 Text Interpretation: Sinus rhythm Borderline ST elevation, lateral leads No significant change since  last tracing Confirmed by Dorie Rank [phone]) on 10/06/2021 4:35:28 PM  Radiology DG CHEST PORT 1 VIEW  Result Date: 10/06/2021 CLINICAL DATA:  Chest pain. EXAM: PORTABLE CHEST 1 VIEW COMPARISON:  Chest x-ray 10/04/2008. FINDINGS: The heart size and mediastinal contours are within normal limits. Both lungs are clear. The visualized skeletal structures are unremarkable. IMPRESSION: No active disease. Electronically Signed   By: Ronney Asters M.D.   On: 10/06/2021 17:30    Procedures Procedures    Medications Ordered in ED Medications  potassium chloride SA (KLOR-CON M) CR tablet 40 mEq (has no administration in time range)  sodium chloride 0.9 % bolus 1,000 mL (1,000 mLs Intravenous New Bag/Given 10/06/21 1753)    ED Course/ Medical Decision Making/ A&P                            Medical Decision Making Amount and/or Complexity of Data Reviewed Labs: ordered. Radiology: ordered.  Risk Prescription drug management.   I discussed this case with my attending physician who cosigned this note including patient's presenting symptoms, physical exam, and planned diagnostics and interventions. Attending physician stated agreement with plan or made changes to plan which were implemented.   Attending physician assessed patient at bedside.  This patient with a history of asthma, drug abuse, anxiety who presents for concern of extreme tachycardia, self-reported drug use, shortness of breath, chest tightness without chest pain.  My emergent differential diagnosis includes acute overdose, respiratory distress, stimulant induced cardiomyopathy, ACS, myocarditis, pericarditis, endocarditis.  Less clinical concern for endocarditis as patient has no history of intravenous drug use, sepsis, no evidence of track marks on physical exam, no infectious symptoms including fever, chills, cough over the last several days.  Independent history obtained from patient's father.  She has no significant accessory  breath sounds.  I dependently reviewed her EKG which shows borderline ST elevation lateral leads, however no significant change since prior tracing.  My attending Dr. Tomi Bamberger independently reviewed these findings and agrees with this interpretation.  I dependently reviewed lab work which significant for mild anemia, hemoglobin 11.9, this is a microcytic anemia, likely secondary to iron deficiency.  Encourage patient to increase iron intake.  Her BMP is significant for moderate hypokalemia with 3.0 potassium.  There are no significant EKG changes.  We will replete orally, encouraged increase potassium intake in diet.  I personally ordered and interpreted a chest x-ray which shows no evidence of cardiopulmonary  disease, I agree with the radiologist interpretation.  On reevaluation patient heart rate improved to high 90s, she appears to be in no respiratory distress, she has no accessory breath sounds.  She is somewhat sleepy, but is able to answer questions without difficulty, appears stable for discharge at this time.  Had extensive discussion regarding drug use, and the importance of discontinuing, as well as following up with rehabilitation, and psychiatric counseling as needed.  Although patient called for complaints of chest pain when she initially caused EMS, she is not endorse chest pain for the entirety of her duration at the emergency department.  Continues to have no chest pain, resolution of feeling of chest tightness, shortness of breath that she had on initial evaluation.  Overall with stable EKG, no ongoing chest pain, improvement of symptoms after fluid rehydration, and rest I have minimal clinical concern for acute stimulant induced cardiomyopathy or other emergent condition at this time.  Patient discharged in stable condition at this time, return precautions given. Final Clinical Impression(s) / ED Diagnoses Final diagnoses:  Drug use  Hypokalemia    Rx / DC Orders ED Discharge Orders      None         Dorien Chihuahua 10/06/21 1849    Dorie Rank, MD 10/08/21 0002

## 2021-10-07 ENCOUNTER — Telehealth: Payer: Self-pay | Admitting: *Deleted

## 2021-10-07 NOTE — Telephone Encounter (Signed)
Transition Care Management Follow-up Telephone Call Date of discharge and from where: 10/06/2021 - [facility] How have you been since you were released from the hospital? "I'm fine" Any questions or concerns? No  Items Reviewed: Did the pt receive and understand the discharge instructions provided? Yes  Medications obtained and verified?  N/A Other? No  Any new allergies since your discharge? No  Dietary orders reviewed? No Do you have support at home? Yes    Functional Questionnaire: (I = Independent and D = Dependent) ADLs: I  Bathing/Dressing- I  Meal Prep- I  Eating- I  Maintaining continence- I  Transferring/Ambulation- I  Managing Meds- I  Follow up appointments reviewed:  PCP Hospital f/u appt confirmed? No   Specialist Hospital f/u appt confirmed? No   Are transportation arrangements needed? No  If their condition worsens, is the pt aware to call PCP or go to the Emergency Dept.? Yes Was the patient provided with contact information for the PCP's office or ED? Yes Was to pt encouraged to call back with questions or concerns? Yes

## 2021-10-29 ENCOUNTER — Emergency Department (HOSPITAL_COMMUNITY)
Admission: EM | Admit: 2021-10-29 | Discharge: 2021-10-29 | Disposition: A | Discharge status: Home / Self Care | Payer: Medicaid Other | Attending: Emergency Medicine | Admitting: Emergency Medicine

## 2021-10-29 ENCOUNTER — Other Ambulatory Visit: Payer: Self-pay

## 2021-10-29 ENCOUNTER — Encounter (HOSPITAL_COMMUNITY): Payer: Self-pay

## 2021-10-29 DIAGNOSIS — M25569 Pain in unspecified knee: Secondary | ICD-10-CM | POA: Diagnosis not present

## 2021-10-29 DIAGNOSIS — M79643 Pain in unspecified hand: Secondary | ICD-10-CM | POA: Diagnosis not present

## 2021-10-29 DIAGNOSIS — Z008 Encounter for other general examination: Secondary | ICD-10-CM

## 2021-10-29 DIAGNOSIS — J45909 Unspecified asthma, uncomplicated: Secondary | ICD-10-CM | POA: Diagnosis not present

## 2021-10-29 DIAGNOSIS — X58XXXA Exposure to other specified factors, initial encounter: Secondary | ICD-10-CM | POA: Diagnosis not present

## 2021-10-29 DIAGNOSIS — S90812A Abrasion, left foot, initial encounter: Secondary | ICD-10-CM | POA: Diagnosis not present

## 2021-10-29 DIAGNOSIS — S0990XA Unspecified injury of head, initial encounter: Secondary | ICD-10-CM | POA: Diagnosis not present

## 2021-10-29 DIAGNOSIS — S80912A Unspecified superficial injury of left knee, initial encounter: Secondary | ICD-10-CM | POA: Diagnosis not present
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
## 2021-10-29 NOTE — Discharge Instructions (Signed)
You have refused any further medical evaluation today, please return for any worsening symptoms.  Follow-up with your primary care provider ?
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
## 2021-10-30 ENCOUNTER — Telehealth: Payer: Self-pay

## 2021-10-30 NOTE — Telephone Encounter (Signed)
Transition Care Management Unsuccessful Follow-up Telephone Call ? ?Date of discharge and from where:  10/29/2021 from [facility] ? ?Attempts:  1st Attempt ? ?Reason for unsuccessful TCM follow-up call:  Unable to leave message ? ?Patient may not be available due to incarceration.  ? ?

## 2021-10-31 NOTE — Telephone Encounter (Signed)
Transition Care Management Unsuccessful Follow-up Telephone Call ? ?Date of discharge and from where:  10/29/2021 from [facility] ? ?Attempts:  2nd Attempt ? ?Reason for unsuccessful TCM follow-up call:  Unable to leave message ? ? ? ?

## 2021-11-05 NOTE — Telephone Encounter (Signed)
Transition Care Management Unsuccessful Follow-up Telephone Call ? ?Date of discharge and from where:  10/29/2021 from [facility] ? ?Attempts:  3rd Attempt ? ?Reason for unsuccessful TCM follow-up call:  Unable to reach patient ? ? ? ?

## 2021-12-22 ENCOUNTER — Encounter: Payer: Self-pay | Admitting: Family
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
## 2021-12-31 ENCOUNTER — Ambulatory Visit (INDEPENDENT_AMBULATORY_CARE_PROVIDER_SITE_OTHER): Payer: Medicaid Other | Admitting: Family

## 2021-12-31 ENCOUNTER — Encounter: Payer: Self-pay | Admitting: Family

## 2021-12-31 ENCOUNTER — Other Ambulatory Visit (HOSPITAL_COMMUNITY)
Admission: RE | Admit: 2021-12-31 | Discharge: 2021-12-31 | Disposition: A | Discharge status: Home / Self Care | Payer: Medicaid Other | Source: Ambulatory Visit | Attending: Family | Admitting: Family

## 2021-12-31 VITALS — BP 115/73 | HR 98 | Temp 98.3°F | Resp 18 | Ht 61.38 in | Wt 209.0 lb

## 2021-12-31 DIAGNOSIS — Z113 Encounter for screening for infections with a predominantly sexual mode of transmission: Secondary | ICD-10-CM | POA: Insufficient documentation

## 2021-12-31 LAB — POCT URINALYSIS DIP (CLINITEK)
Bilirubin, UA: NEGATIVE
Blood, UA: NEGATIVE
Glucose, UA: NEGATIVE mg/dL
Ketones, POC UA: NEGATIVE mg/dL
Leukocytes, UA: NEGATIVE
Nitrite, UA: NEGATIVE
POC PROTEIN,UA: NEGATIVE
Spec Grav, UA: >=1.03 — AB (ref 1.010–1.025)
Urobilinogen, UA: 0.2 E.U./dL
pH, UA: 6 (ref 5.0–8.0)

## 2021-12-31 NOTE — Addendum Note (Signed)
Addended by: Elmon Else on: 12/31/2021 04:38 PM ? ? Modules accepted: Orders ? ?

## 2021-12-31 NOTE — Progress Notes (Signed)
No urinary tract infection.

## 2021-12-31 NOTE — Progress Notes (Signed)
Pt presents for STD screening  ?

## 2022-01-01 LAB — CERVICOVAGINAL ANCILLARY ONLY

## 2022-01-01 NOTE — Progress Notes (Signed)
Please call our office to schedule repeat testing for cervicovaginal ancillary.

## 2022-01-03 ENCOUNTER — Encounter: Payer: Self-pay | Admitting: Family

## 2022-02-04 IMAGING — US US MFM OB DETAIL+14 WK
2 series · 16 of 28 positions shown · non-contrast
Comparison: none

[Series 1: us mfm ob detail+14 wk · 1 of 5 slices shown (1 of 2)]
[im 1/5]
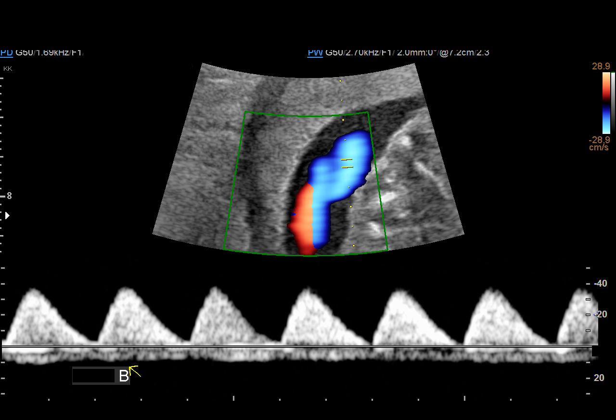

[Series 1: us mfm ob detail+14 wk · 15 of 253 slices shown (2 of 2)]
[im 10/253]
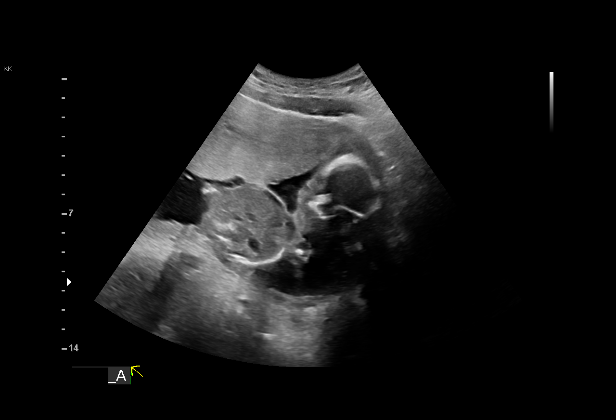
[im 30/253]
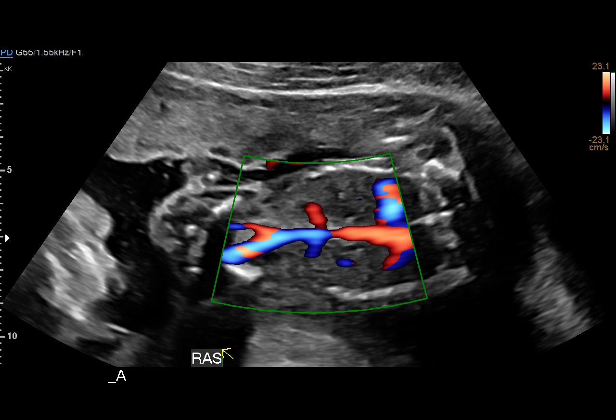
[im 49/253]
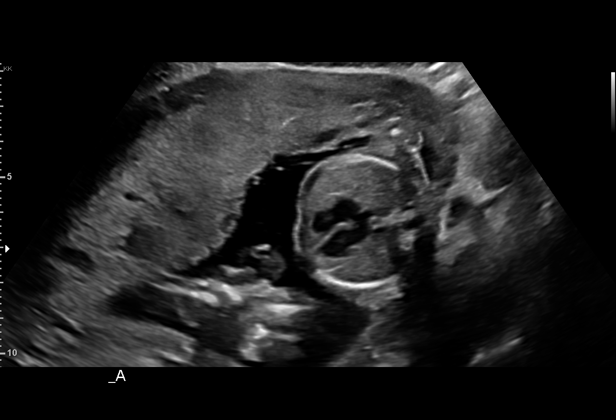
[im 59/253]
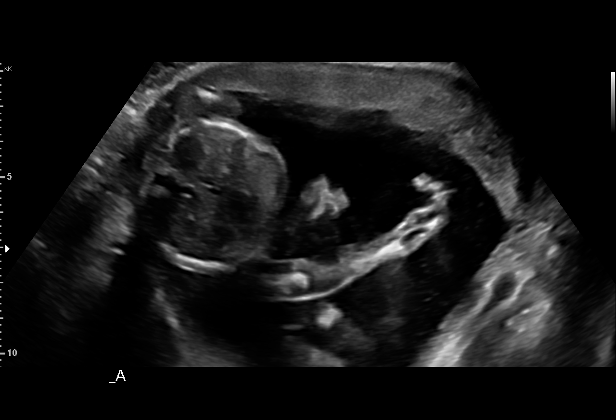
[im 78/253]
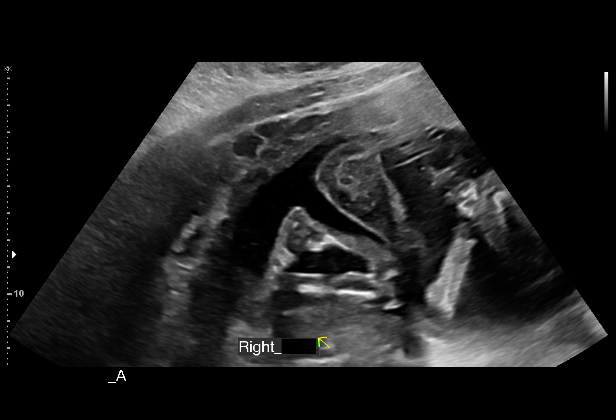
[im 97/253]
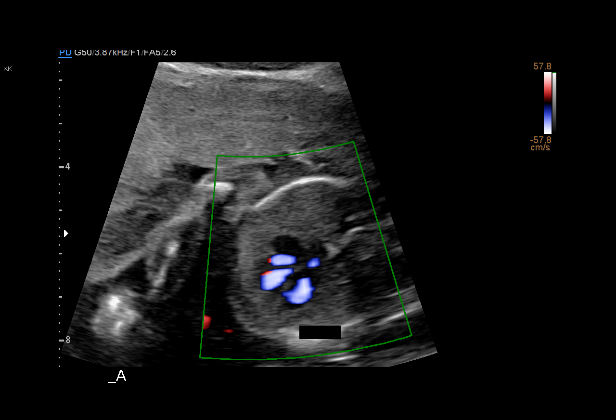
[im 117/253]
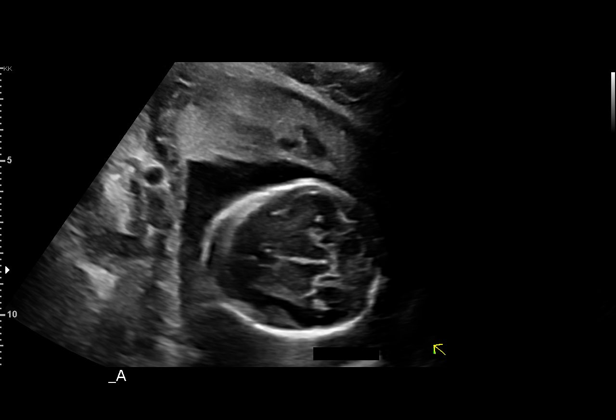
[im 126/253]
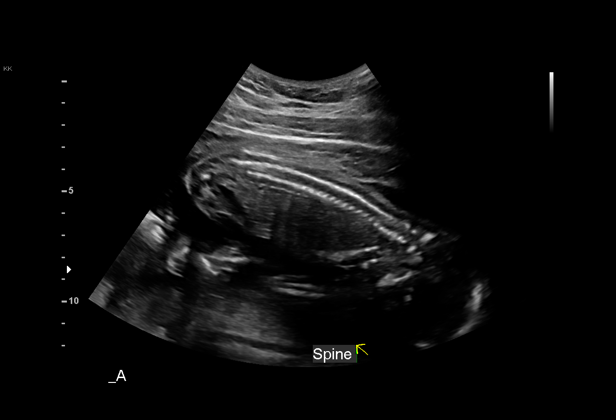
[im 146/253]
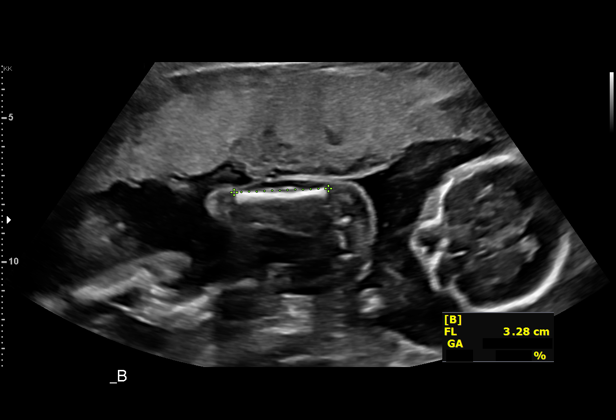
[im 165/253]
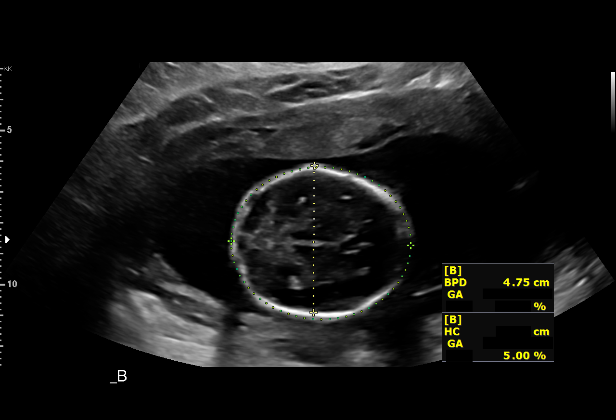
[im 185/253]
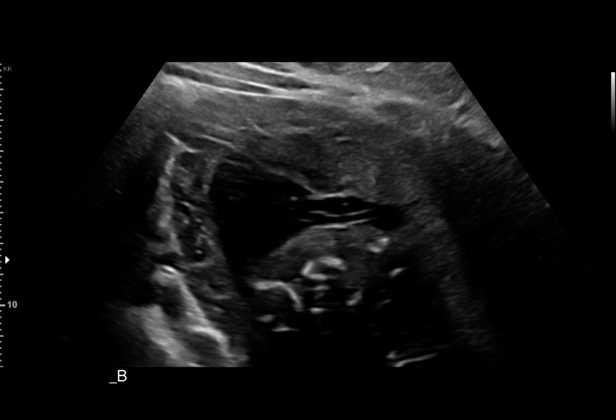
[im 194/253]
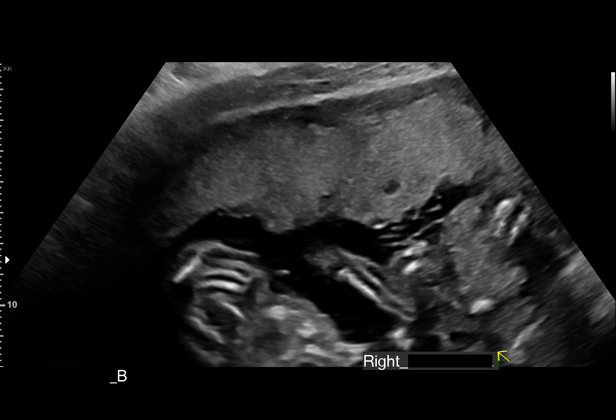
[im 214/253]
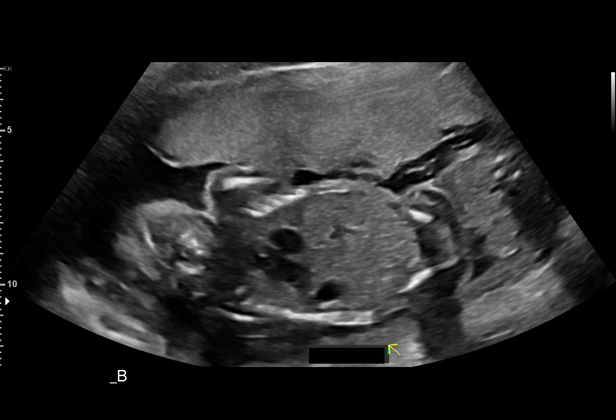
[im 233/253]
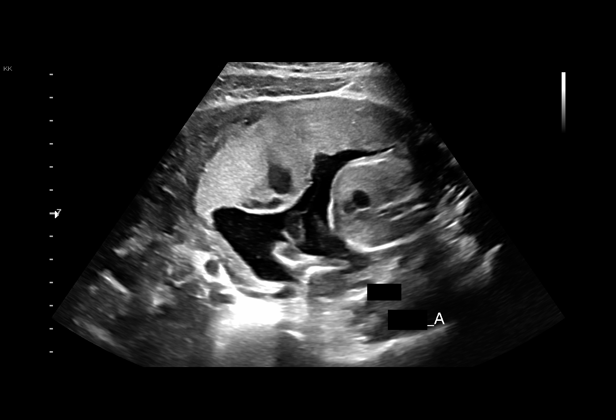
[im 253/253]
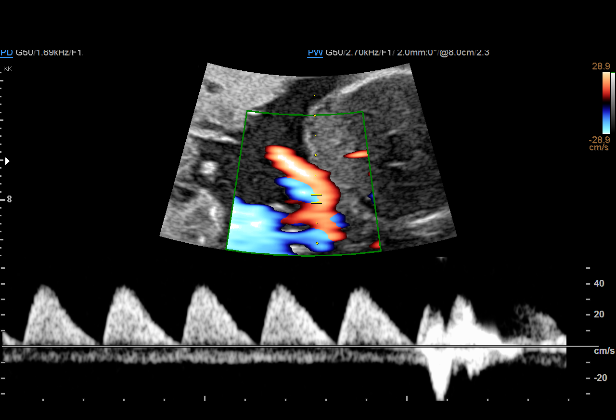

[16 of 28 positions shown; findings below may reference images not displayed]

+14 WK

Indications

 Maternal care for known or suspected poor
 fetal growth, first trimester, fetus 1 IUGR
 Encounter for antenatal screening for
 malformations
 Twin pregnancy, Jim/[place]onda, second trimester
 Obesity complicating pregnancy, second
 trimester (Pregravid BMI 41)
 Anemia during pregnancy in second trimester
 Low Risk NIPS(Negative AFP)
 Genetic carrier (Carrier for Beta-
 Hemoglobinopathy or Beta-
 Thalassemia)(Carrier for SMA)
 Substance abuse affecting pregnancy,
 antepartum
 20 weeks gestation of pregnancy
Fetal Evaluation (Fetus A)

 Num Of Fetuses:         2
 Fetal Heart Rate(bpm):  158
 Cardiac Activity:       Observed
 Fetal Lie:              Lower Left Fetus
 Presentation:           Cephalic
 Placenta:               Anterior
 P. Cord Insertion:      Visualized
 Membrane Desc:      Dividing Membrane seen - Monochorionic

 Amniotic Fluid
 AFI FV:      Within normal limits

                             Largest Pocket(cm)

Biometry (Fetus A)

 BPD:      48.2  mm     G. Age:  20w 4d         43  %    CI:        79.38   %    70 - 86
                                                         FL/HC:      18.3   %    15.9 -
 HC:       171   mm     G. Age:  19w 5d          7  %    HC/AC:      1.23        1.06 -
 AC:      138.8  mm     G. Age:  19w 2d          8  %    FL/BPD:     64.9   %
 FL:       31.3  mm     G. Age:  19w 5d         12  %    FL/AC:      22.6   %    20 - 24
 HUM:      30.7  mm     G. Age:  20w 1d         34  %
 CER:        20  mm     G. Age:  19w 2d         17  %
 NFT:       3.4  mm
 CM:        3.1  mm

 Est. FW:     299  gm    0 lb 11 oz     4.9  %     FW Discordancy        10  %
OB History

 Gravidity:    1
Gestational Age (Fetus A)

 U/S Today:     19w 6d                                        EDD:   03/06/21
 Best:          20w 5d     Det. By:  U/S C R L Fetus A        EDD:   02/28/21
                                     (07/10/20)
Anatomy (Fetus A)

 Cranium:               Appears normal         Aortic Arch:            Not well visualized
 Cavum:                 Appears normal         Ductal Arch:            Not well visualized
 Ventricles:            Appears normal         Diaphragm:              Appears normal
 Choroid Plexus:        Appears normal         Stomach:                Appears normal, left
                                                                       sided
 Cerebellum:            Appears normal         Abdomen:                Appears normal
 Posterior Fossa:       Appears normal         Abdominal Wall:         Appears nml (cord
                                                                       insert, abd wall)
 Nuchal Fold:           Not applicable (>20    Cord Vessels:           Appears normal (3
                        wks GA)                                        vessel cord)
 Face:                  Orbits and profile     Kidneys:                Appear normal
                        previously seen
 Lips:                  Appears normal         Bladder:                Appears normal
 Thoracic:              Appears normal         Spine:                  Appears normal
 Heart:                 Appears normal         Upper Extremities:      Not well visualized
                        (4CH, axis, and
                        situs)
 RVOT:                  Not well visualized    Lower Extremities:      Appears normal
 LVOT:                  Not well visualized

 Other:  Fetus a female.Heel Right visualized. Technically difficult due to
         maternal habitus and fetal position.
Doppler - Fetal Vessels (Fetus A)
 Umbilical Artery
  S/D     %tile      RI    %tile
   5.5       92    0.84       92

Fetal Evaluation (Fetus B)

 Num Of Fetuses:         2
 Fetal Heart Rate(bpm):  155
 Cardiac Activity:       Observed
 Fetal Lie:              Upper Fetus
 Presentation:           Breech
 Placenta:               Anterior
 P. Cord Insertion:      Visualized
 Membrane Desc:      Dividing Membrane seen - Monochorionic

 Amniotic Fluid
 AFI FV:      Within normal limits

                             Largest Pocket(cm)

Biometry (Fetus B)

 BPD:      47.6  mm     G. Age:  20w 3d         36  %    CI:        79.61   %    70 - 86
                                                         FL/HC:      19.3   %    15.9 -
 HC:      168.6  mm     G. Age:  19w 4d        4.3  %    HC/AC:      1.13        1.06 -
 AC:      149.4  mm     G. Age:  20w 1d         27  %    FL/BPD:     68.3   %
 FL:       32.5  mm     G. Age:  20w 1d         23  %    FL/AC:      21.8   %    20 - 24
 HUM:      31.7  mm     G. Age:  20w 4d         45  %
 CER:      21.8  mm     G. Age:  20w 4d         58  %
 NFT:       4.6  mm

 LV:        5.6  mm
 CM:        3.6  mm

 Est. FW:     333  gm    0 lb 12 oz      17  %     FW Discordancy     0 \ 10 %
Gestational Age (Fetus B)

 U/S Today:     20w 1d                                        EDD:   03/04/21
 Best:          20w 5d     Det. By:  U/S C R L Fetus A        EDD:   02/28/21
                                     (07/10/20)
Anatomy (Fetus B)

 Cranium:               Appears normal         Aortic Arch:            Not well visualized
 Cavum:                 Appears normal         Ductal Arch:            Not well visualized
 Ventricles:            Appears normal         Diaphragm:              Appears normal
 Choroid Plexus:        Appears normal         Stomach:                Appears normal, left
                                                                       sided
 Cerebellum:            Appears normal         Abdomen:                Appears normal
 Posterior Fossa:       Appears normal         Abdominal Wall:         Appears nml (cord
                                                                       insert, abd wall)
 Nuchal Fold:           Appears normal         Cord Vessels:           Appears normal (3
                                                                       vessel cord)
 Face:                  Not well visualized    Kidneys:                Appear normal
 Lips:                  Not well visualized    Bladder:                Appears normal
 Thoracic:              Appears normal         Spine:                  Appears normal
 Heart:                 Not well visualized    Upper Extremities:      Appears normal
 RVOT:                  Not well visualized    Lower Extremities:      Appears normal
 LVOT:                  Not well visualized

 Other:  Technically difficult due to maternal habitus and fetal position.
Doppler - Fetal Vessels (Fetus B)

 Umbilical Artery
  S/D     %tile      RI    %tile                             ADFV    RDFV
  3.66       36    0.83       89                                No      No

Cervix Uterus Adnexa

 Cervix
 Length:           3.07  cm.
 Normal appearance by transabdominal scan.

 Adnexa
 No abnormality visualized.
Impression

 Antenatal testing for monochorionic diamnoitic twin
 pregnancy with IUGR in Twin B.
 Twin A normal stomach, amniotic fluid, bladder EFW 4.9%
 with AC 8%  - Maternal left lower, Cephalic
 Twin B normal stomach, amniotic fluid, bladder EFW 17%
 with AC of 27% - Maternal right upper, Breech
 UA Dopplers are normal for Twin A with no evidence of AEDF
 or REDF.

 There is no evidence of TTTS.

 She had a low risk NIPS and neg AFP.
Recommendations

 Return in 2 weeks for TTTS/TAPS screening
 Repeat growth in 4 weeks.

## 2022-04-30 IMAGING — US US MFM FETAL BPP W/O NON-STRESS
1 series · 13 of 28 positions shown · non-contrast
Comparison: none

[Series 1: us mfm fetal bpp w/o non-stress · 78 acquisitions, 13 frames shown]
[im 3/78]
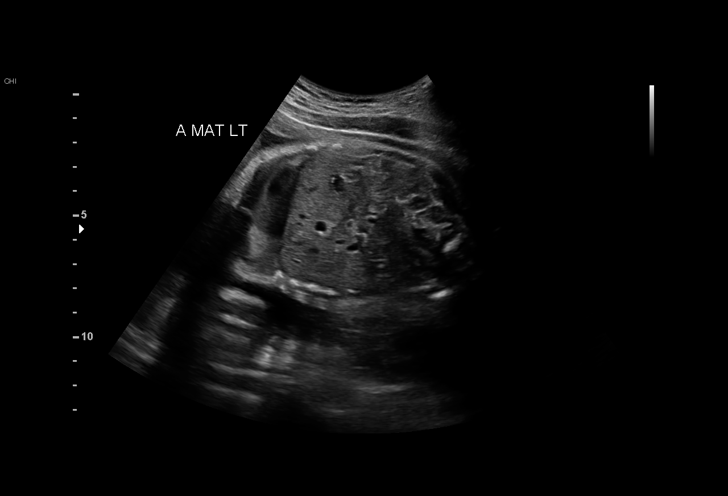
[im 9/78]
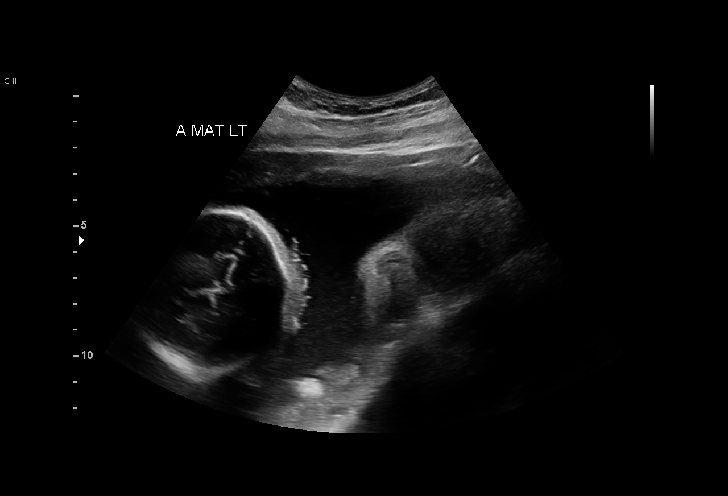
[im 15/78]
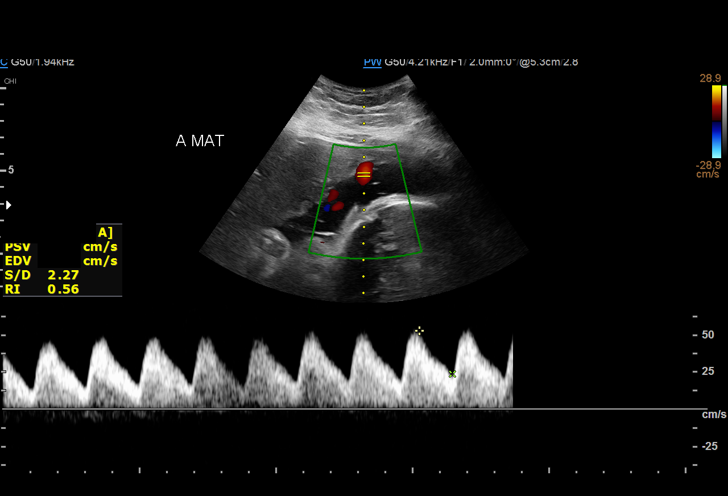
[im 20/78]
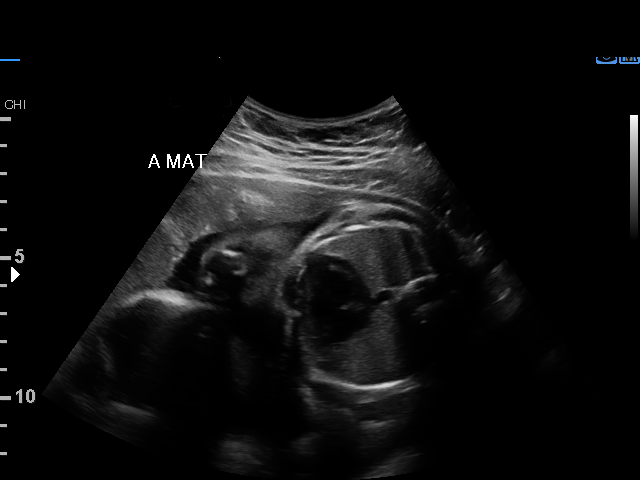
[im 26/78]
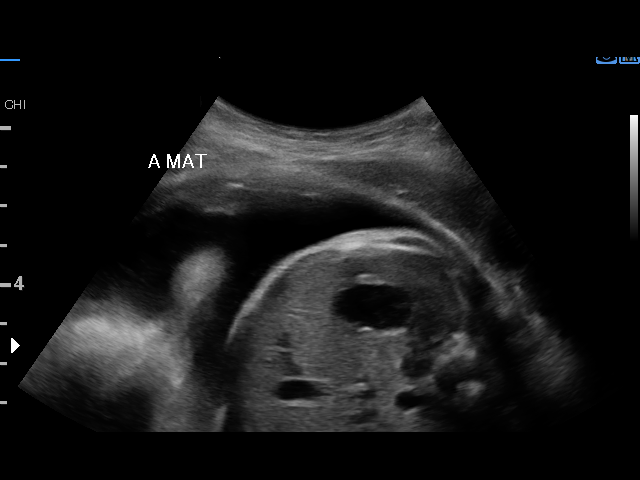
[im 32/78]
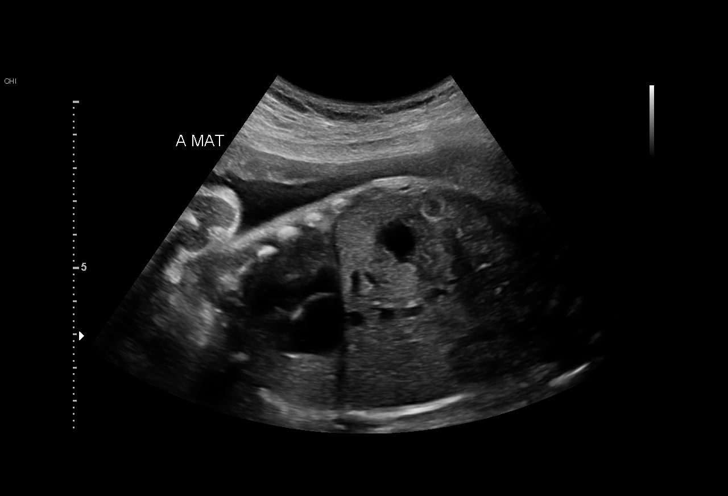
[im 40/78]
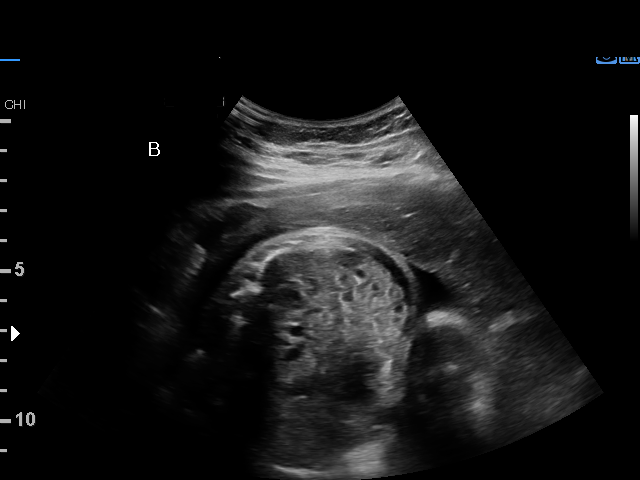
[im 46/78]
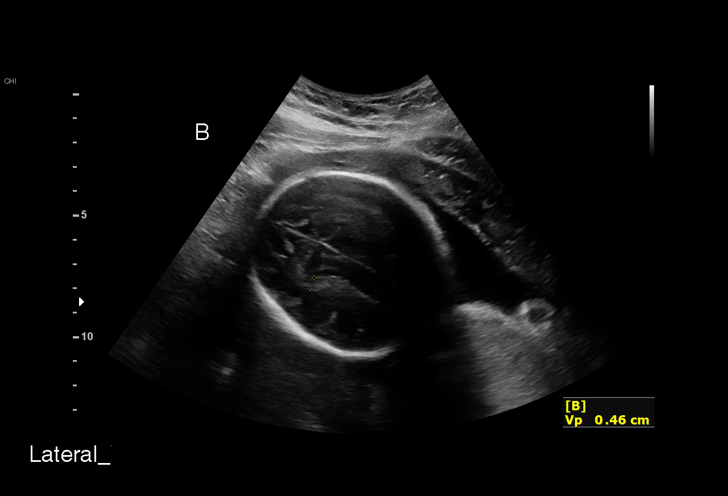
[im 52/78]
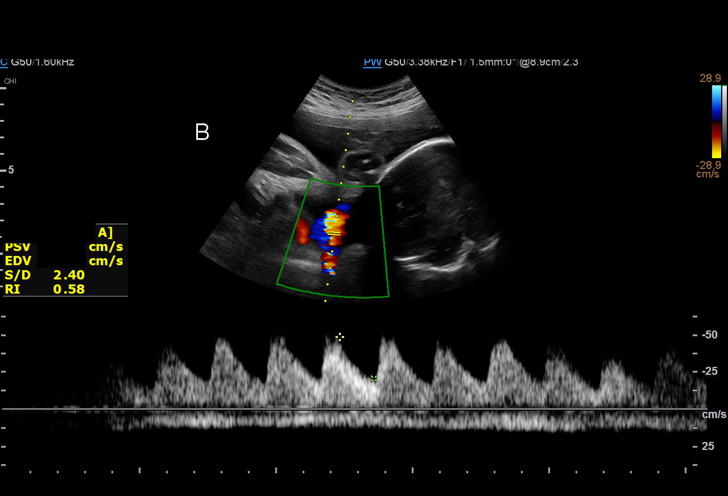
[im 58/78]
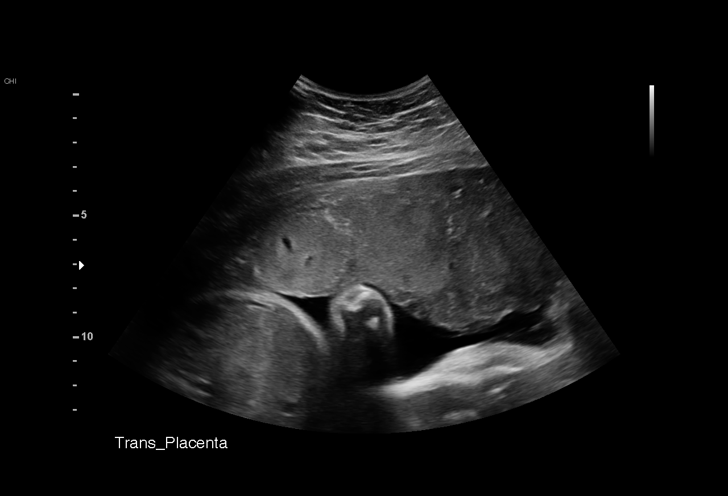
[im 63/78]
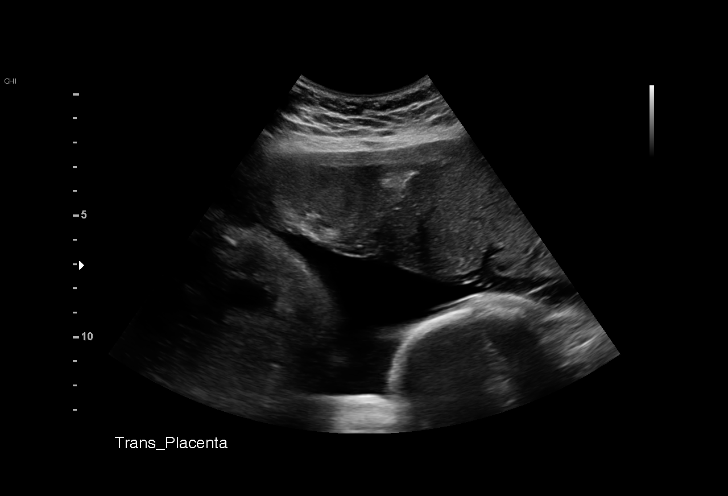
[im 69/78]
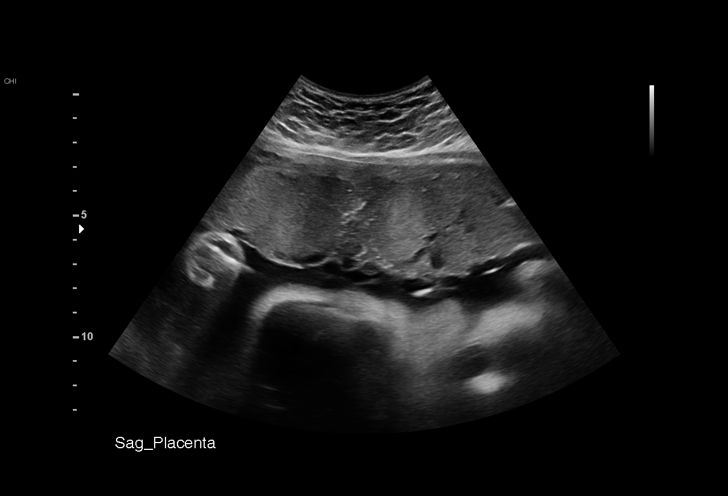
[im 75/78]
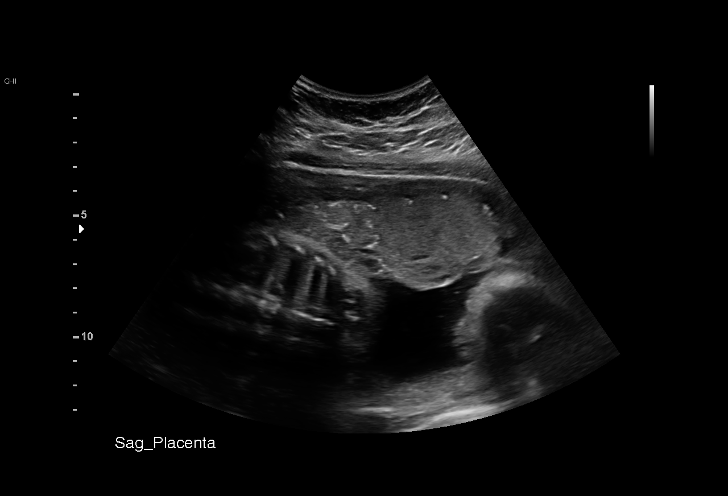

[13 of 28 positions shown; findings below may reference images not displayed]

ADDL GESTATION
    GEST RE EVAL

Indications

 Twin pregnancy, [place], third trimester
 Maternal care for known or suspected poor
 fetal growth, third trimester, fetus 1 IUGR
 Obesity complicating pregnancy, third
 trimester (BMI 41)
 Anemia during pregnancy in third trimester
 Genetic carrier (Carrier for Beta-
 Hemoglobinopathy or Beta-
 Thalassemia)(Carrier for SMA)
 Substance abuse affecting pregnancy,
 antepartum
 Low Risk NIPS(Negative AFP)
 32 weeks gestation of pregnancy
Fetal Evaluation (Fetus A)

 Num Of Fetuses:         2
 Fetal Heart Rate(bpm):  152
 Cardiac Activity:       Observed
 Fetal Lie:              Lower Fetus Maternal Left
 Presentation:           Breech
 Placenta:               Anterior
 P. Cord Insertion:      Previously Visualized
 Membrane Desc:      Dividing Membrane seen - Monochorionic

 Amniotic Fluid
 AFI FV:      Within normal limits

                             Largest Pocket(cm)

Biophysical Evaluation (Fetus A)

 Amniotic F.V:   Pocket => 2 cm             F. Tone:        Observed
 F. Movement:    Observed                   Score:          [DATE]
 F. Breathing:   Observed
Biometry (Fetus A)

 LV:        5.5  mm
OB History

 Gravidity:    1
Gestational Age (Fetus A)

 Best:          32w 6d     Det. By:  U/S C R L Fetus A        EDD:   02/28/21
                                     (07/10/20)
Anatomy (Fetus A)

 Ventricles:            Appears normal         Stomach:                Appears normal, left
                                                                       sided
 Heart:                 Appears normal         Kidneys:                Appear normal
                        (4CH, axis, and
                        situs)
 Diaphragm:             Appears normal         Bladder:                Appears normal
Doppler - Fetal Vessels (Fetus A)

 Umbilical Artery
  S/D     %tile      RI    %tile                             ADFV    RDFV
  2.48       41     0.6       49                                No      No

Fetal Evaluation (Fetus B)

 Num Of Fetuses:         2
 Fetal Heart Rate(bpm):  143
 Cardiac Activity:       Observed
 Fetal Lie:              Upper Fetus
 Presentation:           Oblique head RLQ
 Placenta:               Anterior
 P. Cord Insertion:      Previously Visualized
 Membrane Desc:      Dividing Membrane seen - Monochorionic
 Amniotic Fluid
 AFI FV:      Within normal limits

                             Largest Pocket(cm)

Biophysical Evaluation (Fetus B)

 Amniotic F.V:   Pocket => 2 cm             F. Tone:        Observed
 F. Movement:    Observed                   Score:          [DATE]
 F. Breathing:   Observed
Biometry (Fetus B)

 LV:        4.6  mm
Gestational Age (Fetus B)

 Best:          32w 6d     Det. By:  U/S C R L Fetus A        EDD:   02/28/21
                                     (07/10/20)
Anatomy (Fetus B)

 Ventricles:            Appears normal         Kidneys:                Appear normal
 Heart:                 Appears normal         Bladder:                Appears normal
                        (4CH, axis, and
                        situs)
 Stomach:               Appears normal, left
                        sided
Doppler - Fetal Vessels (Fetus B)

 Umbilical Artery
  S/D     %tile      RI    %tile                             ADFV    RDFV
  2.62       51    0.62       58                                No      No

Cervix Uterus Adnexa

 Cervix
 Not visualized (advanced GA >84wks)
Impression

 Monochorionic-diamniotic twin pregnancy with fetal growth
 restriction in both twins.  Patient returned for BPP and UA
 Doppler studies.
 Twin A: Lower fetus, maternal left, breech presentation,
 anterior placenta.  Amniotic fluid is normal and good fetal
 activity seen.  Antenatal testing is reassuring.  BPP [DATE].
 Umbilical artery Doppler showed normal forward diastolic flow.
 Twin B: Upper fetus, oblique lie and head to right lower
 quadrant, anterior placenta. Amniotic fluid is normal and good
 fetal activity seen.  Antenatal testing is reassuring.  BPP [DATE].
 Umbilical artery Doppler showed normal forward diastolic flow.

 We reassured the patient of the findings.
Recommendations

 -NST on 01/13/2021 at the [facility]
 -Delivery at 34 weeks gestation.
                 Jumper, Klever

## 2022-05-28 ENCOUNTER — Encounter: Payer: Self-pay | Admitting: Family

## 2022-06-01 NOTE — Telephone Encounter (Signed)
LM on pts VM to call back to schedule a Nurse visit

## 2022-08-17 DIAGNOSIS — H5213 Myopia, bilateral: Secondary | ICD-10-CM | POA: Diagnosis not present

## 2022-09-02 DIAGNOSIS — I499 Cardiac arrhythmia, unspecified: Secondary | ICD-10-CM | POA: Diagnosis not present

## 2022-09-02 DIAGNOSIS — R1111 Vomiting without nausea: Secondary | ICD-10-CM | POA: Diagnosis not present

## 2022-10-01 DIAGNOSIS — 419620001 Death: Secondary | SNOMED CT | POA: Diagnosis not present

## 2022-10-01 DEATH — deceased
